# Patient Record
Sex: Male | Born: 1965 | Race: White | Hispanic: No | Marital: Married | Smoking: Never smoker
Health system: Southern US, Community
[De-identification: ages and names within clinical notes are randomized; demographics above are authoritative.]

## PROBLEM LIST (undated history)

## (undated) DIAGNOSIS — E119 Type 2 diabetes mellitus without complications: Secondary | ICD-10-CM

## (undated) DIAGNOSIS — E669 Obesity, unspecified: Secondary | ICD-10-CM

## (undated) DIAGNOSIS — E785 Hyperlipidemia, unspecified: Secondary | ICD-10-CM

## (undated) DIAGNOSIS — M1990 Unspecified osteoarthritis, unspecified site: Secondary | ICD-10-CM

## (undated) DIAGNOSIS — Z9989 Dependence on other enabling machines and devices: Secondary | ICD-10-CM

## (undated) DIAGNOSIS — K219 Gastro-esophageal reflux disease without esophagitis: Secondary | ICD-10-CM

## (undated) DIAGNOSIS — I10 Essential (primary) hypertension: Secondary | ICD-10-CM

## (undated) DIAGNOSIS — G4733 Obstructive sleep apnea (adult) (pediatric): Secondary | ICD-10-CM

## (undated) HISTORY — DX: Gastro-esophageal reflux disease without esophagitis: K21.9

## (undated) HISTORY — DX: Essential (primary) hypertension: I10

## (undated) HISTORY — DX: Hyperlipidemia, unspecified: E78.5

## (undated) HISTORY — PX: NO PAST SURGERIES: SHX2092

---

## 1999-05-19 DIAGNOSIS — I10 Essential (primary) hypertension: Secondary | ICD-10-CM

## 1999-05-19 HISTORY — DX: Essential (primary) hypertension: I10

## 2009-12-26 ENCOUNTER — Encounter: Payer: Self-pay | Admitting: Cardiology

## 2010-01-07 ENCOUNTER — Encounter: Payer: Self-pay | Admitting: Cardiology

## 2010-01-10 ENCOUNTER — Ambulatory Visit: Payer: Self-pay | Admitting: Cardiology

## 2010-01-10 ENCOUNTER — Encounter: Payer: Self-pay | Admitting: Cardiology

## 2010-01-27 ENCOUNTER — Ambulatory Visit: Payer: Self-pay | Admitting: Cardiology

## 2010-01-27 DIAGNOSIS — E669 Obesity, unspecified: Secondary | ICD-10-CM | POA: Insufficient documentation

## 2010-01-27 DIAGNOSIS — R0989 Other specified symptoms and signs involving the circulatory and respiratory systems: Secondary | ICD-10-CM

## 2010-01-27 DIAGNOSIS — I10 Essential (primary) hypertension: Secondary | ICD-10-CM | POA: Insufficient documentation

## 2010-01-27 DIAGNOSIS — I517 Cardiomegaly: Secondary | ICD-10-CM | POA: Insufficient documentation

## 2010-01-27 DIAGNOSIS — R0602 Shortness of breath: Secondary | ICD-10-CM | POA: Insufficient documentation

## 2010-01-27 DIAGNOSIS — R0609 Other forms of dyspnea: Secondary | ICD-10-CM | POA: Insufficient documentation

## 2010-01-27 DIAGNOSIS — I059 Rheumatic mitral valve disease, unspecified: Secondary | ICD-10-CM | POA: Insufficient documentation

## 2010-01-27 DIAGNOSIS — E876 Hypokalemia: Secondary | ICD-10-CM | POA: Insufficient documentation

## 2010-02-07 ENCOUNTER — Encounter: Payer: Self-pay | Admitting: Cardiology

## 2010-03-03 ENCOUNTER — Encounter: Payer: Self-pay | Admitting: Cardiology

## 2010-06-17 NOTE — Letter (Signed)
Summary: External Correspondence/ [facility]  External Correspondence/ [facility]   Imported By: Dorise Hiss 01/15/2010 15:20:32  _____________________________________________________________________  External Attachment:    Type:   Image     Comment:   External Document

## 2010-06-17 NOTE — Letter (Signed)
Summary: External Correspondence/ [facility]  External Correspondence/ [facility]   Imported By: Dorise Hiss 01/15/2010 15:19:28  _____________________________________________________________________  External Attachment:    Type:   Image     Comment:   External Document

## 2010-06-17 NOTE — Letter (Signed)
Summary: Appointment -missed  [facility] at [facility] [address]   [place], [place] [address]   Phone: [phone]  Fax: [phone]     March 03, 2010 MRN: [id number]     CLINTEN HOWK [address] [place], [place]  [address]     Dear Mr. Donzetta Matters,  Our records indicate you missed your appointment on March 03, 2010                        with Dr. Antoine Poche.   It is very important that we reach you to reschedule this appointment. We look forward to participating in your health care needs.   Please contact us at the number listed above at your earliest convenience to reschedule this appointment.   Sincerely,    Glass blower/designer

## 2010-06-17 NOTE — Letter (Signed)
Summary: External Correspondence/ FAXED [facility]ERR[facility]  External Correspondence/ FAXED [facility]   Imported By: Dorise Hiss 03/04/2010 15:27:10  _____________________________________________________________________  External Attachment:    Type:   Image     Comment:   External Document

## 2010-06-17 NOTE — Assessment & Plan Note (Signed)
Summary: NP-MANAGEMENT OF HYPERTENSION   Visit Type:  Initial Consult Primary Provider:  Kathe Mariner at [facility]  CC:  HTN.  History of Present Illness: The patient is referred for evaluation of difficult to control hypertension. He has had high blood pressure for years. Recently it was out of control as he was not taking his medicine. However, despite being back on an aggressive regimen he still is not at target blood pressure. An echocardiogram done recently demonstrated well-preserved left ventricular systolic function with moderate to severe left ventricular hypertrophy.  The patient has had no prior cardiac workup. He has had long-standing hypertension. He does get short of breath but only with significant exertion. He can push a lawnmower without dyspnea. He does not describe PND or orthopnea. He does not describe palpitations, presyncope or syncope. He has not chest neck or arm discomfort. He does have loud snoring and apparent apneic episodes. He has daytime headaches and somnolence.  Preventive Screening-Counseling & Management  Alcohol-Tobacco     Smoking Status: never  Current Medications (verified): 1)  Valturna 300-320 Mg Tabs (Aliskiren-Valsartan) .... Take 1 Tablet By Mouth Once A Day 2)  Hydrochlorothiazide 12.5 Mg Caps (Hydrochlorothiazide) .... Take 1 Tablet By Mouth Once A Day 3)  Metoprolol Tartrate 50 Mg Tabs (Metoprolol Tartrate) .... Take 1 Tablet By Mouth Two Times A Day 4)  Clonidine Hcl 0.1 Mg Tabs (Clonidine Hcl) .... Take 1 Tablet By Mouth Two Times A Day 5)  Simvastatin 40 Mg Tabs (Simvastatin) .... Take 1 Tablet By Mouth Once A Day 6)  Zyrtec Hives Relief 10 Mg Tabs (Cetirizine Hcl) .... Take 1 Tablet By Mouth Once A Day  Allergies (verified): No Known Drug Allergies  Comments:  Nurse/Medical Assistant: The patient's medication list and allergies were reviewed with the patient and were updated in the Medication and Allergy Lists.  Past  History:  Past Medical History: Hypertension x years Hyperlipidemia recent  Past Surgical History: None  Family History: Father Diabetes, HTN, CVA Mother Diabetes Brother CHF age 50  Social History: FULL TIME-US Comptroller  Alcohol Use - no Drug Use - no Divorced, no children Tobacco Smoking Status:  never  Review of Systems       Positive for sinus problems. Otherwise as stated in the history of present illness negative for all other systems.  Vital Signs:  Patient profile:   44 year old male Height:      74 inches Weight:      328 pounds BMI:     42.26 Pulse rate:   76 / minute BP sitting:   185 / 112  (left arm) Cuff size:   large  Vitals Entered By: Carlye Grippe (January 27, 2010 2:23 PM)  Nutrition Counseling: Patient's BMI is greater than 25 and therefore counseled on weight management options. CC: HTN   Physical Exam  General:  Well developed, well nourished, in no acute distress. Head:  normocephalic and atraumatic Eyes:  PERRLA/EOM intact; conjunctiva and lids normal. Mouth:  Teeth, gums and palate normal. Oral mucosa normal. Neck:  Neck supple, no JVD. No masses, thyromegaly or abnormal cervical nodes. Chest Wall:  no deformities or breast masses noted Lungs:  Clear bilaterally to auscultation and percussion. Abdomen:  Bowel sounds positive; abdomen soft and non-tender without masses, organomegaly, or hernias noted. No hepatosplenomegaly, obese Msk:  Back normal, normal gait. Muscle strength and tone normal. Extremities:  No clubbing or cyanosis. Neurologic:  Alert and oriented x 3. Skin:  Intact without lesions  or rashes. Cervical Nodes:  no significant adenopathy Axillary Nodes:  no significant adenopathy Inguinal Nodes:  no significant adenopathy Psych:  Normal affect.   Detailed Cardiovascular Exam  Neck    Carotids: Carotids full and equal bilaterally without bruits.      Neck Veins: Normal, no JVD.    Heart    Inspection:  no deformities or lifts noted.      Palpation: normal PMI with no thrills palpable.      Auscultation: regular rate and rhythm, S1, S2 without murmurs, rubs, or clicks.  Positive S4  Vascular    Abdominal Aorta: no palpable masses, pulsations, or audible bruits.      Femoral Pulses: normal femoral pulses bilaterally.      Pedal Pulses: normal pedal pulses bilaterally.      Radial Pulses: normal radial pulses bilaterally.      Peripheral Circulation: no clubbing, cyanosis,  with normal capillary refill.     EKG  Procedure date:  12/26/2009  Findings:      Sinus rhythm, rate 79, T wave inversions one in aVL, no old EKG for comparison  Impression & Recommendations:  Problem # 1:  HYPERTENSION (ICD-401.9) His blood pressure is difficult to control despite an aggressive regimen. He'll most definitely has sleep apnea contributing to this. We discussed this at length. We discussed therapeutic lifestyle changes. In particular he needs increased exercise and weight loss. If in the next few weeks his blood pressure is not coming down and is not successful with this I would most likely add amlodipine to his already aggressive regimen.  Problem # 2:  SNORING (ICD-786.09) The patient almost definitely has sleep apnea and he will have a sleep study and appropriate referral. We discussed the physiology and the need for weight loss.  Problem # 3:  OBESITY, UNSPECIFIED (ICD-278.00) I had a long discussion with him with specific instructions on dietary and weight loss issues as well as exercise.  Problem # 4:  CARDIOMEGALY (ICD-429.3) His LVH is related to his hypertension. This may regress as his blood pressure is controlled.  Other Orders: Neurology Referral (Neuro)  Patient Instructions: 1)  Sleep study - referral to Dr. Ninetta Lights 2)  Follow up in  6 weeks.

## 2010-11-24 ENCOUNTER — Encounter: Payer: Self-pay | Admitting: Cardiology

## 2013-05-18 DIAGNOSIS — E119 Type 2 diabetes mellitus without complications: Secondary | ICD-10-CM

## 2013-05-18 HISTORY — DX: Type 2 diabetes mellitus without complications: E11.9

## 2014-12-03 ENCOUNTER — Encounter (HOSPITAL_COMMUNITY): Admission: AD | Disposition: A | Discharge status: Home / Self Care | Payer: Self-pay | Source: Ambulatory Visit

## 2014-12-03 ENCOUNTER — Encounter (HOSPITAL_COMMUNITY): Payer: Self-pay | Admitting: Physician Assistant

## 2014-12-03 ENCOUNTER — Observation Stay (HOSPITAL_COMMUNITY)
Admission: AD | Admit: 2014-12-03 | Discharge: 2014-12-04 | Disposition: A | Discharge status: Home / Self Care | Payer: Federal, State, Local not specified - PPO | Source: Ambulatory Visit | Attending: Cardiovascular Disease | Admitting: Cardiovascular Disease

## 2014-12-03 DIAGNOSIS — I208 Other forms of angina pectoris: Secondary | ICD-10-CM | POA: Diagnosis present

## 2014-12-03 DIAGNOSIS — Z23 Encounter for immunization: Secondary | ICD-10-CM | POA: Diagnosis not present

## 2014-12-03 DIAGNOSIS — E119 Type 2 diabetes mellitus without complications: Secondary | ICD-10-CM | POA: Diagnosis not present

## 2014-12-03 DIAGNOSIS — I209 Angina pectoris, unspecified: Secondary | ICD-10-CM | POA: Diagnosis not present

## 2014-12-03 DIAGNOSIS — I200 Unstable angina: Secondary | ICD-10-CM | POA: Diagnosis not present

## 2014-12-03 DIAGNOSIS — I10 Essential (primary) hypertension: Secondary | ICD-10-CM | POA: Diagnosis not present

## 2014-12-03 DIAGNOSIS — G4733 Obstructive sleep apnea (adult) (pediatric): Secondary | ICD-10-CM | POA: Diagnosis not present

## 2014-12-03 DIAGNOSIS — E785 Hyperlipidemia, unspecified: Secondary | ICD-10-CM

## 2014-12-03 DIAGNOSIS — R9431 Abnormal electrocardiogram [ECG] [EKG]: Secondary | ICD-10-CM | POA: Diagnosis not present

## 2014-12-03 DIAGNOSIS — E669 Obesity, unspecified: Secondary | ICD-10-CM | POA: Insufficient documentation

## 2014-12-03 HISTORY — DX: Unspecified osteoarthritis, unspecified site: M19.90

## 2014-12-03 HISTORY — DX: Dependence on other enabling machines and devices: Z99.89

## 2014-12-03 HISTORY — DX: Obstructive sleep apnea (adult) (pediatric): G47.33

## 2014-12-03 HISTORY — DX: Obesity, unspecified: E66.9

## 2014-12-03 HISTORY — DX: Type 2 diabetes mellitus without complications: E11.9

## 2014-12-03 LAB — TROPONIN I

## 2014-12-03 LAB — GLUCOSE, CAPILLARY
GLUCOSE-CAPILLARY: 141 mg/dL — AB (ref 65–99)
Glucose-Capillary: 111 mg/dL — ABNORMAL HIGH (ref 65–99)

## 2014-12-03 LAB — CREATININE, SERUM
Creatinine, Ser: 1.06 mg/dL (ref 0.61–1.24)
GFR calc Af Amer: >60 mL/min (ref >60)
GFR calc non Af Amer: >60 mL/min (ref >60)

## 2014-12-03 LAB — CBC
HCT: 39.1 % (ref 39.0–52.0)
Hemoglobin: 12.8 g/dL — ABNORMAL LOW (ref 13.0–17.0)
MCH: 30 pg (ref 26.0–34.0)
MCHC: 32.7 g/dL (ref 30.0–36.0)
MCV: 91.6 fL (ref 78.0–100.0)
Platelets: 333 10*3/uL (ref 150–400)
RBC: 4.27 MIL/uL (ref 4.22–5.81)
RDW: 13.9 % (ref 11.5–15.5)
WBC: 7.9 10*3/uL (ref 4.0–10.5)

## 2014-12-03 LAB — TSH: TSH: 0.889 u[IU]/mL (ref 0.350–4.500)

## 2014-12-03 SURGERY — LEFT HEART CATH AND CORONARY ANGIOGRAPHY

## 2014-12-03 MED ORDER — LISINOPRIL 20 MG PO TABS
20.0000 mg | ORAL_TABLET | Freq: Every day | ORAL | Status: DC
  Administered 2014-12-04: 20 mg via ORAL
  Filled 2014-12-03 (×2): qty 1

## 2014-12-03 MED ORDER — NITROGLYCERIN 0.4 MG SL SUBL: 0.4000 mg | SUBLINGUAL_TABLET | SUBLINGUAL | Status: DC | PRN

## 2014-12-03 MED ORDER — HYDROCHLOROTHIAZIDE 12.5 MG PO CAPS
12.5000 mg | ORAL_CAPSULE | Freq: Every day | ORAL | Status: DC
  Administered 2014-12-04: 12.5 mg via ORAL
  Filled 2014-12-03 (×2): qty 1

## 2014-12-03 MED ORDER — LORATADINE 10 MG PO TABS
10.0000 mg | ORAL_TABLET | Freq: Every day | ORAL | Status: DC
  Administered 2014-12-04: 10 mg via ORAL
  Filled 2014-12-03 (×2): qty 1

## 2014-12-03 MED ORDER — ACETAMINOPHEN 325 MG PO TABS
650.0000 mg | ORAL_TABLET | ORAL | Status: DC | PRN
  Administered 2014-12-03: 650 mg via ORAL
  Filled 2014-12-03: qty 2

## 2014-12-03 MED ORDER — SODIUM CHLORIDE 0.9 % IJ SOLN: 3.0000 mL | INTRAMUSCULAR | Status: DC | PRN

## 2014-12-03 MED ORDER — SODIUM CHLORIDE 0.9 % IJ SOLN
3.0000 mL | Freq: Two times a day (BID) | INTRAMUSCULAR | Status: DC
  Administered 2014-12-03 – 2014-12-04 (×2): 3 mL via INTRAVENOUS

## 2014-12-03 MED ORDER — CLONIDINE HCL 0.1 MG PO TABS
0.1000 mg | ORAL_TABLET | Freq: Two times a day (BID) | ORAL | Status: DC
  Administered 2014-12-03 – 2014-12-04 (×2): 0.1 mg via ORAL
  Filled 2014-12-03 (×3): qty 1

## 2014-12-03 MED ORDER — INSULIN ASPART 100 UNIT/ML ~~LOC~~ SOLN: 0.0000 [IU] | Freq: Three times a day (TID) | SUBCUTANEOUS | Status: DC

## 2014-12-03 MED ORDER — ENOXAPARIN SODIUM 40 MG/0.4ML ~~LOC~~ SOLN
40.0000 mg | SUBCUTANEOUS | Status: DC
  Administered 2014-12-03: 40 mg via SUBCUTANEOUS
  Filled 2014-12-03 (×2): qty 0.4

## 2014-12-03 MED ORDER — ZOLPIDEM TARTRATE 5 MG PO TABS
5.0000 mg | ORAL_TABLET | Freq: Every evening | ORAL | Status: DC | PRN
  Filled 2014-12-03: qty 1

## 2014-12-03 MED ORDER — LISINOPRIL-HYDROCHLOROTHIAZIDE 20-12.5 MG PO TABS: 1.0000 | ORAL_TABLET | Freq: Every day | ORAL | Status: DC

## 2014-12-03 MED ORDER — SIMVASTATIN 40 MG PO TABS
40.0000 mg | ORAL_TABLET | Freq: Every day | ORAL | Status: DC
  Administered 2014-12-04: 40 mg via ORAL
  Filled 2014-12-03 (×2): qty 1

## 2014-12-03 MED ORDER — METOPROLOL TARTRATE 50 MG PO TABS
50.0000 mg | ORAL_TABLET | Freq: Two times a day (BID) | ORAL | Status: DC
  Administered 2014-12-03 – 2014-12-04 (×2): 50 mg via ORAL
  Filled 2014-12-03 (×3): qty 1

## 2014-12-03 MED ORDER — ONDANSETRON HCL 4 MG/2ML IJ SOLN: 4.0000 mg | Freq: Four times a day (QID) | INTRAMUSCULAR | Status: DC | PRN

## 2014-12-03 MED ORDER — ALPRAZOLAM 0.25 MG PO TABS
0.2500 mg | ORAL_TABLET | Freq: Two times a day (BID) | ORAL | Status: DC | PRN
  Filled 2014-12-03: qty 1

## 2014-12-03 MED ORDER — PNEUMOCOCCAL VAC POLYVALENT 25 MCG/0.5ML IJ INJ
0.5000 mL | INJECTION | INTRAMUSCULAR | Status: AC
  Administered 2014-12-04: 0.5 mL via INTRAMUSCULAR
  Filled 2014-12-03: qty 0.5

## 2014-12-03 MED ORDER — INSULIN ASPART 100 UNIT/ML ~~LOC~~ SOLN: 0.0000 [IU] | Freq: Every day | SUBCUTANEOUS | Status: DC

## 2014-12-03 MED ORDER — SODIUM CHLORIDE 0.9 % IV SOLN
250.0000 mL | INTRAVENOUS | Status: DC | PRN
Start: 2014-12-03 — End: 2014-12-04

## 2014-12-03 NOTE — Progress Notes (Signed)
Patient had arrived to unit 3E via [facility] as a direct admit. Patient taken to room 3E29. Rosaria Ferries, PA on unit and aware of patients arrival. Patient alert and oriented x4 and appears to be in no distress at this time. Patient has no complaints. Patient placed on cardiac monitor, CCMD notified. Patient oriented to unit, call bell and telephone in reach.

## 2014-12-03 NOTE — Progress Notes (Signed)
Cpap placed at bedside. Pt states he will attempt to wear later.

## 2014-12-03 NOTE — H&P (Signed)
History and Physical   Patient ID: Timothy Mcdonald MRN: [id number], DOB/AGE: [birth year] 49 y.o. Date of Encounter: 12/03/2014  Primary Physician: [facility], Dr Quintin Alto Primary Cardiologist: Dr Percival Spanish, 2011 in [place] for HTN  Chief Complaint:  abnl ECG, ?CAD  HPI: Timothy Mcdonald is a 49 y.o. male with a history of HTN, HL, DM, obesity, OSA. He has been having back problems and went to see his family physician. He has lumbar pain and occasional numbness that sometimes affects his ability to use his R leg.  While he was at his primary MD's office, he mentioned left arm numbness that was intermittent. His primary care physician did an ECG and it was concerning for STEMI so he was sent urgently to [facility], his ECG was still abnormal and he was sent to [facility] as a STEMI for cath. At [facility], he was evaluated by Dr. Gwenlyn Found and his ECG was repeated. It was felt that the abnormal ECG was due to LVH and Timothy Mcdonald is pain-free. It was decided to admit Timothy Mcdonald for observation and further management.  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 He feels that he is active with his job but does not exercise. He never gets chest pain with exertion. He feels he would probably have to stop going up a flight of steps due to shortness of breath but not chest pain. He notes that he has been sleeping in a chair recently but that is because of his left arm issues. When he sleeps in a bed or tries to sleep lying on his back, he will be wakened by left arm numbness and pain. He sleeps in a recliner, and props up his left side. He is able to rest that way. He does have some chronic orthopnea that has not changed recently, but denies PND. He has some mild lower extremity edema, left greater than right that is chronic and also has not changed recently.  He has no family history of premature CAD, but does have a history of CHF in his brother (without known CAD), and a history of  hypertension and diabetes in his father.   Past Medical History  Diagnosis Date  . HTN (hypertension) 2001  . HLD (hyperlipidemia)   . Diabetes mellitus type II, non insulin dependent 2015  . Obesity     Surgical History:  Past Surgical History  Procedure Laterality Date  . None       I have reviewed the patient's current medications. Medication  Amlodipine 5 mg qd  cetirizine (ZYRTEC HIVES RELIEF) 10 MG tablet  cloNIDine (CATAPRES) 0.1 MG tablet bid  Lisinopril/HCTZ 20/12.5 qd  metoprolol (LOPRESSOR) 50 MG tablet BID  Metformin 500 mg bid  simvastatin (ZOCOR) 40 MG tablet   Scheduled Meds: . cloNIDine  0.1 mg Oral BID  . enoxaparin (LOVENOX) injection  40 mg Subcutaneous Q24H  . lisinopril  20 mg Oral Daily   And  . hydrochlorothiazide  12.5 mg Oral Daily  . insulin aspart  0-15 Units Subcutaneous TID WC  . insulin aspart  0-5 Units Subcutaneous QHS  . loratadine  10 mg Oral Daily  . metoprolol  50 mg Oral BID  . simvastatin  40 mg Oral Daily  . sodium chloride  3 mL Intravenous Q12H   Continuous Infusions:   PRN Meds:.sodium chloride, acetaminophen, ALPRAZolam, nitroGLYCERIN, ondansetron (ZOFRAN) IV, sodium chloride, zolpidem  Allergies: No Known Allergies  History   Social History  . Marital Status:  Married    Spouse Name: N/A  . Number of Children: N/A  . Years of Education: N/A   Occupational History  . [employer]    Social History Main Topics  . Smoking status: Never Smoker   . Smokeless tobacco: Never Used  . Alcohol Use: No  . Drug Use: No  . Sexual Activity: Not on file   Other Topics Concern  . Not on file   Social History Narrative   Married, lives in [place]. No history of CAD in parents or siblings. Brother has CHF, not sure why.   Full time - [employer].     Family History  Problem Relation Age of Onset  . Heart failure Brother    Family Status  Relation Status Death Age  . Father [place], diabetes, HTN,  CVA  . Mother Deceased 88    diabetes  . Brother Alive     CHF - age 78, many other health issues    Review of Systems:   Full 14-point review of systems otherwise negative except as noted above.  Physical Exam: Blood pressure 149/97, pulse 69, temperature 98.3 F (36.8 C), temperature source Oral, resp. rate 18, height 6\' 2"  (1.88 m), weight 334 lb 1.6 oz (151.547 kg), SpO2 97 %. General: Well developed, well nourished,male in no acute distress. Head: Normocephalic, atraumatic, sclera non-icteric, no xanthomas, nares are without discharge. Dentition: false teeth Neck: No carotid bruits. JVD not elevated. No thyromegally Lungs: Good expansion bilaterally. without wheezes or rhonchi.  Heart: Regular rate and rhythm with S1 S2.  No S3 or S4.  No murmur, no rubs, or gallops appreciated. Abdomen: Soft, non-tender, non-distended with normoactive bowel sounds. No hepatomegaly. No rebound/guarding. No obvious abdominal masses. Msk:  Strength and tone appear normal for age. No joint deformities or effusions, no spine or costo-vertebral angle tenderness. Extremities: No clubbing or cyanosis. No edema.  Distal pedal pulses are 2+ in 4 extrem Neuro: Alert and oriented X 3. Moves all extremities spontaneously. No focal deficits noted. Psych:  Responds to questions appropriately with a normal affect. Skin: No rashes or lesions noted  ECG: SR, LVH, w/ J point elevation in V1&2 with lateral TWI  ASSESSMENT AND PLAN:  Principal Problem:   Possible Unstable angina, abnormal ECG - admit observation, r/o MI - ECG is abnormal, follow - monitor for ischemic symptoms - if ez remain negative, OP MV and f/u in [place] - ck echo  Active Problems:   HTN (hypertension) - continue home rx      HLD (hyperlipidemia) - ck LFTs and profile - continue statin    Diabetes mellitus type II, non insulin dependent - hold metformin in case dye study needed - SSI, ck A1c  Plan: if ez are negative, d/c in am w/  OP MV (2-day study)  Signed, Rosaria Ferries, PA-C 12/03/2014 4:03 PM Beeper [phone]  I saw & examined the patient on the day after admission (was seen by Dr. Gwenlyn Found yesterday).   He has ruled out for MI. EKG is abnormal & he will need ischemic evaluation with TM Myoview as OP.  We will arrange OP f/u.    Restart home DM & BP meds. On stating for RF modification.  Echo done - formal read pending.  Expect d/c as Trop Neg.  Leonie Man, MD

## 2014-12-04 ENCOUNTER — Observation Stay (HOSPITAL_BASED_OUTPATIENT_CLINIC_OR_DEPARTMENT_OTHER): Payer: Federal, State, Local not specified - PPO

## 2014-12-04 ENCOUNTER — Other Ambulatory Visit: Payer: Self-pay | Admitting: Physician Assistant

## 2014-12-04 DIAGNOSIS — I200 Unstable angina: Secondary | ICD-10-CM | POA: Diagnosis not present

## 2014-12-04 DIAGNOSIS — I10 Essential (primary) hypertension: Secondary | ICD-10-CM | POA: Diagnosis not present

## 2014-12-04 DIAGNOSIS — E785 Hyperlipidemia, unspecified: Secondary | ICD-10-CM | POA: Diagnosis not present

## 2014-12-04 DIAGNOSIS — R079 Chest pain, unspecified: Secondary | ICD-10-CM | POA: Diagnosis not present

## 2014-12-04 DIAGNOSIS — R9431 Abnormal electrocardiogram [ECG] [EKG]: Secondary | ICD-10-CM | POA: Diagnosis present

## 2014-12-04 DIAGNOSIS — I209 Angina pectoris, unspecified: Secondary | ICD-10-CM | POA: Diagnosis not present

## 2014-12-04 DIAGNOSIS — R0789 Other chest pain: Secondary | ICD-10-CM

## 2014-12-04 DIAGNOSIS — I208 Other forms of angina pectoris: Secondary | ICD-10-CM | POA: Diagnosis present

## 2014-12-04 DIAGNOSIS — E669 Obesity, unspecified: Secondary | ICD-10-CM | POA: Diagnosis present

## 2014-12-04 DIAGNOSIS — E119 Type 2 diabetes mellitus without complications: Secondary | ICD-10-CM | POA: Diagnosis not present

## 2014-12-04 LAB — GLUCOSE, CAPILLARY
GLUCOSE-CAPILLARY: 113 mg/dL — AB (ref 65–99)
GLUCOSE-CAPILLARY: 111 mg/dL — AB (ref 65–99)

## 2014-12-04 LAB — COMPREHENSIVE METABOLIC PANEL
ALK PHOS: 57 U/L (ref 38–126)
ALT: 18 U/L (ref 17–63)
AST: 16 U/L (ref 15–41)
Albumin: 3.6 g/dL (ref 3.5–5.0)
Anion gap: 5 (ref 5–15)
BUN: 15 mg/dL (ref 6–20)
CO2: 30 mmol/L (ref 22–32)
Calcium: 9.5 mg/dL (ref 8.9–10.3)
Chloride: 103 mmol/L (ref 101–111)
Creatinine, Ser: 1.13 mg/dL (ref 0.61–1.24)
GFR calc Af Amer: >60 mL/min (ref >60)
GFR calc non Af Amer: >60 mL/min (ref >60)
Glucose, Bld: 131 mg/dL — ABNORMAL HIGH (ref 65–99)
Potassium: 3.4 mmol/L — ABNORMAL LOW (ref 3.5–5.1)
SODIUM: 138 mmol/L (ref 135–145)
Total Bilirubin: 0.7 mg/dL (ref 0.3–1.2)
Total Protein: 6.2 g/dL — ABNORMAL LOW (ref 6.5–8.1)

## 2014-12-04 LAB — LIPID PANEL
Cholesterol: 214 mg/dL — ABNORMAL HIGH (ref 0–200)
HDL: 32 mg/dL — ABNORMAL LOW (ref >40)
LDL Cholesterol: 142 mg/dL — ABNORMAL HIGH (ref 0–99)
TRIGLYCERIDES: 201 mg/dL — AB (ref <150)
Total CHOL/HDL Ratio: 6.7 RATIO
VLDL: 40 mg/dL (ref 0–40)

## 2014-12-04 LAB — HEMOGLOBIN A1C
HEMOGLOBIN A1C: 6.6 % — AB (ref 4.8–5.6)
Mean Plasma Glucose: 143 mg/dL

## 2014-12-04 LAB — TROPONIN I: Troponin I: <0.03 ng/mL (ref <0.031)

## 2014-12-04 MED ORDER — CLONIDINE HCL 0.1 MG PO TABS: 0.1000 mg | ORAL_TABLET | Freq: Two times a day (BID) | ORAL | Status: DC

## 2014-12-04 MED ORDER — METOPROLOL TARTRATE 50 MG PO TABS: 50.0000 mg | ORAL_TABLET | Freq: Two times a day (BID) | ORAL | Status: DC

## 2014-12-04 MED ORDER — PERFLUTREN LIPID MICROSPHERE
1.0000 mL | INTRAVENOUS | Status: AC | PRN
  Administered 2014-12-04: 2 mL via INTRAVENOUS
  Filled 2014-12-04: qty 10

## 2014-12-04 NOTE — Progress Notes (Signed)
   12/04/14 0050  BiPAP/CPAP/SIPAP  BiPAP/CPAP/SIPAP Pt Type Adult  Mask Type Nasal mask  Mask Size Medium  Set Rate 0 breaths/min  Respiratory Rate 18 breaths/min  IPAP (imax 20)  EPAP (e min 6)  BiPAP/CPAP/SIPAP CPAP  Patient Home Equipment No  Auto Titrate Yes  BiPAP/CPAP /SiPAP Vitals  Pulse Rate 74  SpO2 94 %  pt placed on cpap with above settings. Tolerating well at this time.

## 2014-12-04 NOTE — Progress Notes (Signed)
  Echocardiogram 2D Echocardiogram has been performed.  Jennette Dubin 12/04/2014, 11:36 AM

## 2014-12-04 NOTE — Discharge Summary (Signed)
Discharge Summary   Patient ID: Timothy Mcdonald,  MRN: [id number], DOB/AGE: 06-03-1965 49 y.o.  Admit date: 12/03/2014 Discharge date: 12/04/2014  Primary Care Provider: Jamal Maes Primary Cardiologist: Dr Percival Spanish, 2011 in [place] for HTN. Will establish with [facility]  Discharge Diagnoses Principal Problem:   Atypical angina - L arm numbness triggerred EKG, no Chest discomfort Active Problems:   Essential hypertension   Hyperlipidemia with target LDL less than 100   Diabetes mellitus type II, non insulin dependent   Obese   Abnormal finding on EKG   Allergies No Known Allergies  Procedures  Echocardiogram 12/04/2014 LV EF: 65% -  70%  ------------------------------------------------------------------- Indications:   Chest pain 786.51.  ------------------------------------------------------------------- History:  PMH:  Dyspnea. Risk factors: Obesity. Hypertension. Diabetes mellitus. Dyslipidemia.  ------------------------------------------------------------------- Study Conclusions  - Left ventricle: The cavity size was normal. Wall thickness was increased in a pattern of severe LVH. Systolic function was vigorous. The estimated ejection fraction was in the range of 65% to 70%. Wall motion was normal; there were no regional wall motion abnormalities. Doppler parameters are consistent with abnormal left ventricular relaxation (grade 1 diastolic dysfunction). - Left atrium: The atrium was mildly dilated. - Right atrium: The atrium was mildly dilated.  Impressions:  - Technically difficult; definity used; vigorous LV function; severe LVH; grade 1 diastolic dysfunction; mild biatrial enlargement.    Hospital Course  Patient is a 49 year old male with history of HTN, HLD, DM, obesity, OSA. He was having back pain issues and went to see his primary care physician. While at his PCPs office, he mentioned left arm numbness which was  intermittent. EKG obtained concerning for STEMI and he was sent emergently to [facility]. He continued to be abnormal EKG while at [facility] and was transferred to [facility] subsequently for urgent cath. He was evaluated by Dr. Gwenlyn Found and EKG was repeated, it was felt abnormal EKG was due to LVH and he was chest pain-free on arrival. He was admitted to cardiology service for observation overnight. He denies any recent paroxysmal nocturnal dyspnea and there was no HF signs on exam.   Overnight, his serial troponin was negative. Lipid panel was obtained on 7/19 which showed cholesterol 214, triglycerides 201, HDL 32, LDL 142. Echocardiogram was obtained which shows normal EF 65-70%, no regional wall motion abnormalities, grade 1 diastolic dysfunction. Given negative troponin, he is deemed stable for discharge from cardiology perspective. I have arranged outpatient 2 day treadmill nuclear stress test at [facility]. He has been instructed to hold metoprolol in the morning of the test and be NPO 6 hours prior to the test. I have also arranged 2 weeks outpatient follow-up with Dr. Harl Bowie in our [place] office.  Discharge Vitals Blood pressure 151/88, pulse 68, temperature 98 F (36.7 C), temperature source Oral, resp. rate 18, height 6\' 2"  (1.88 m), weight 333 lb 4.8 oz (151.184 kg), SpO2 97 %.  Filed Weights   12/03/14 1454 12/04/14 0657  Weight: 334 lb 1.6 oz (151.547 kg) 333 lb 4.8 oz (151.184 kg)    PHYSICAL EXAM  General: Pleasant, NAD. Morbidly obese Neuro: Alert and oriented X 3. Moves all extremities spontaneously. Psych: Normal affect. HEENT: Normal Neck: Supple without bruits or JVD. Lungs: Resp regular and unlabored, CTA. Heart: RRR no s3, s4, or murmurs. Abdomen: Soft, non-tender, non-distended, BS + x 4.  Extremities: No clubbing, cyanosis or edema. DP/PT/Radials 2+ and equal bilaterally.  Labs  CBC  Recent Labs  12/03/14 1513  WBC 7.9  HGB 12.8*  HCT 39.1    MCV 91.6  PLT 458   Basic Metabolic Panel  Recent Labs  12/03/14 1513 12/04/14 0305  NA  --  138  K  --  3.4*  CL  --  103  CO2  --  30  GLUCOSE  --  131*  BUN  --  15  CREATININE 1.06 1.13  CALCIUM  --  9.5   Liver Function Tests  Recent Labs  12/04/14 0305  AST 16  ALT 18  ALKPHOS 57  BILITOT 0.7  PROT 6.2*  ALBUMIN 3.6   Cardiac Enzymes  Recent Labs  12/03/14 1513 12/03/14 2050 12/04/14 0305  TROPONINI <0.03 <0.03 <0.03   Hemoglobin A1C  Recent Labs  12/03/14 1513  HGBA1C 6.6*   Fasting Lipid Panel  Recent Labs  12/04/14 0305  CHOL 214*  HDL 32*  LDLCALC 142*  TRIG 201*  CHOLHDL 6.7   Thyroid Function Tests  Recent Labs  12/03/14 1513  TSH 0.889    Disposition  Pt is being discharged home today in good condition.  Follow-up Plans & Appointments      Follow-up Information    Follow up with Carlyle Dolly, MD On 12/24/2014.   Specialty:  Cardiology   Why:  @2 :00pm   Contact information:   [place] [address] [phone]       Follow up with [facility].   Why:  Hospital will contact you to arrange 2 day nuclear stress test, no food or drink in AM of the test, hold metoprolol in the morning as well, sips of water with medication ok    Contact information:   [address] [place] [phone]      Discharge Medications    Medication List    TAKE these medications        amLODipine 5 MG tablet  Commonly known as:  NORVASC  Take 5 mg by mouth daily.     cloNIDine 0.1 MG tablet  Commonly known as:  CATAPRES  Take 1 tablet (0.1 mg total) by mouth 2 (two) times daily.     [employer] BACKACHE RELIEF 500 MG Tabs  Generic drug:  Magnesium Salicylate  Take 2 tablets by mouth daily as needed (back pain).     fluticasone 50 MCG/ACT nasal spray  Commonly known as:  FLONASE  Place 1 spray into both nostrils daily as needed for allergies or rhinitis.      ibuprofen 200 MG tablet  Commonly known as:  ADVIL,MOTRIN  Take 800 mg by mouth daily as needed (back pain).     ICY HOT EX  Apply 1 application topically daily. Apply to lower back     lisinopril-hydrochlorothiazide 20-12.5 MG per tablet  Commonly known as:  PRINZIDE,ZESTORETIC  Take 1 tablet by mouth daily.     metFORMIN 500 MG 24 hr tablet  Commonly known as:  GLUCOPHAGE-XR  Take 500 mg by mouth daily with breakfast.     metoprolol 50 MG tablet  Commonly known as:  LOPRESSOR  Take 1 tablet (50 mg total) by mouth 2 (two) times daily.     simvastatin 40 MG tablet  Commonly known as:  ZOCOR  Take 40 mg by mouth daily.     ZYRTEC HIVES RELIEF 10 MG tablet  Generic drug:  cetirizine  Take 10 mg by mouth daily.        Outstanding Labs/Studies  2 day treadmill nuclear stress test  Duration of Discharge Encounter   Greater than 30 minutes including physician time.  Hilbert Corrigan PA-C Pager: [phone] 12/04/2014, 12:09 PM

## 2014-12-05 ENCOUNTER — Telehealth: Payer: Self-pay | Admitting: Cardiovascular Disease

## 2014-12-05 NOTE — Telephone Encounter (Signed)
D/C phone call Appt on 12/24/14 w/ Dr.Branch .[name] Thanks

## 2014-12-05 NOTE — Telephone Encounter (Signed)
Timothy Mcdonald is returning the phone cal . Please call .[name]

## 2014-12-05 NOTE — Telephone Encounter (Signed)
LMTCB  Patient contacted regarding discharge from [facility] on 12/04/2014.  Patient understands to follow up with provider Dr Harl Bowie on 12/24/2014 at 2:00pm at [facility]. Patient understands discharge instructions? yes  Patient understands medications and regiment? yes  Patient understands to bring all medications to this visit? yes   Spoke with Wife Tashan Kreitzer, patient heart is doing fine, still having some back issues; has appt with pcp next week.

## 2014-12-17 ENCOUNTER — Other Ambulatory Visit: Payer: Self-pay | Admitting: Cardiology

## 2014-12-17 DIAGNOSIS — R079 Chest pain, unspecified: Secondary | ICD-10-CM

## 2014-12-18 ENCOUNTER — Encounter (HOSPITAL_COMMUNITY)
Admission: RE | Admit: 2014-12-18 | Discharge: 2014-12-18 | Disposition: A | Discharge status: Home / Self Care | Payer: Federal, State, Local not specified - PPO | Source: Ambulatory Visit | Attending: Cardiology | Admitting: Cardiology

## 2014-12-18 ENCOUNTER — Ambulatory Visit (HOSPITAL_COMMUNITY): Admission: RE | Admit: 2014-12-18 | Payer: Self-pay | Source: Ambulatory Visit

## 2014-12-18 ENCOUNTER — Encounter (HOSPITAL_COMMUNITY): Payer: Self-pay

## 2014-12-18 DIAGNOSIS — R079 Chest pain, unspecified: Secondary | ICD-10-CM | POA: Insufficient documentation

## 2014-12-18 MED ORDER — REGADENOSON 0.4 MG/5ML IV SOLN
INTRAVENOUS | Status: AC
  Filled 2014-12-18: qty 5

## 2014-12-18 MED ORDER — TECHNETIUM TC 99M SESTAMIBI - CARDIOLITE
30.0000 | Freq: Once | INTRAVENOUS | Status: AC | PRN
  Administered 2014-12-18: 29.7 via INTRAVENOUS

## 2014-12-18 MED ORDER — SODIUM CHLORIDE 0.9 % IJ SOLN
INTRAMUSCULAR | Status: AC
  Administered 2014-12-18: 10 mL via INTRAVENOUS
  Filled 2014-12-18: qty 3

## 2014-12-19 ENCOUNTER — Encounter (HOSPITAL_COMMUNITY)
Admission: RE | Admit: 2014-12-19 | Discharge: 2014-12-19 | Disposition: A | Discharge status: Home / Self Care | Payer: Federal, State, Local not specified - PPO | Source: Ambulatory Visit | Attending: Cardiology | Admitting: Cardiology

## 2014-12-19 ENCOUNTER — Encounter (HOSPITAL_COMMUNITY): Payer: Self-pay

## 2014-12-19 DIAGNOSIS — R079 Chest pain, unspecified: Secondary | ICD-10-CM

## 2014-12-19 LAB — NM MYOCAR MULTI W/SPECT W/WALL MOTION / EF
Estimated workload: 9.2 METS
Exercise duration (min): 7 min
Exercise duration (sec): 2 s
LV dias vol: 145 mL
LV sys vol: 68 mL
MPHR: 173 {beats}/min
Peak HR: 160 {beats}/min
Percent HR: 92 %
RATE: 0.3
Rest HR: 85 {beats}/min
SDS: 6
SRS: 1
SSS: 7
TID: 0.93

## 2014-12-19 MED ORDER — TECHNETIUM TC 99M SESTAMIBI GENERIC - CARDIOLITE
25.0000 | Freq: Once | INTRAVENOUS | Status: AC | PRN
  Administered 2014-12-19: 23 via INTRAVENOUS

## 2014-12-24 ENCOUNTER — Encounter: Payer: Self-pay | Admitting: Cardiology

## 2014-12-25 ENCOUNTER — Encounter: Payer: Self-pay | Admitting: Cardiology

## 2014-12-25 ENCOUNTER — Ambulatory Visit (INDEPENDENT_AMBULATORY_CARE_PROVIDER_SITE_OTHER): Payer: Federal, State, Local not specified - PPO | Admitting: Cardiology

## 2014-12-25 VITALS — BP 136/87 | HR 71 | Ht 74.0 in | Wt 332.0 lb

## 2014-12-25 DIAGNOSIS — I517 Cardiomegaly: Secondary | ICD-10-CM

## 2014-12-25 DIAGNOSIS — I10 Essential (primary) hypertension: Secondary | ICD-10-CM

## 2014-12-25 DIAGNOSIS — E785 Hyperlipidemia, unspecified: Secondary | ICD-10-CM | POA: Diagnosis not present

## 2014-12-25 DIAGNOSIS — G4733 Obstructive sleep apnea (adult) (pediatric): Secondary | ICD-10-CM

## 2014-12-25 MED ORDER — ROSUVASTATIN CALCIUM 5 MG PO TABS: 5.0000 mg | ORAL_TABLET | Freq: Every day | ORAL | Status: DC

## 2014-12-25 MED ORDER — AMLODIPINE BESYLATE 10 MG PO TABS: 10.0000 mg | ORAL_TABLET | Freq: Every day | ORAL | Status: DC

## 2014-12-25 NOTE — Patient Instructions (Signed)
Your physician recommends that you schedule a follow-up appointment in: [place] DR. Ladson  Your physician has recommended you make the following change in your medication:   STOP ZOCOR  START CRESTOR 5 MG DAILY  INCREASE AMLODIPINE 10 MG DAILY  START ASPIRIN 81 MG DAILY  Your physician has requested that you regularly monitor and record your blood pressure readings at home FOR 2 WEEKS AND CALL RESULTS TO [place]. Please use the same machine at the same time of day to check your readings and record them to bring to your follow-up visit.  Thank you for choosing [facility]!!

## 2014-12-25 NOTE — Progress Notes (Signed)
Patient ID: Timothy Mcdonald, male   DOB: Oct 04, 1965, 49 y.o.   MRN: [id number]     Clinical Summary [name] is a 49 y.o.male seen as a hospital follow up appointment, this is our first visit together.  1. Abnormal EKG - recent admit 11/2014 with left arm numbess and abnormal EKG. No chest pain - serial troponins negative. Echo with LVEF 65-70%, no WMAs, severe LVH. Discharged with plans for outpatient stress test. - 12/2014 exercise MPI downsloping ST depression inferior leads in setting of hypertensive exercise response, normal perfusion imaging. Exercised 7 minutes.  - denies any recent chest pain or SOB  2. HTN - checks occasionally at home, 140s/80s - compliant with meds  3. HL - 11/2014 TC 214 TG 201 HDL 32 LDL 142 - liptor caused muscle aches, has been on simva 40mg  daily.   4. OSA - mixed compliance    5. DM2 - followed by pcp  Past Medical History  Diagnosis Date  . HTN (hypertension) 2001  . HLD (hyperlipidemia)   . Obesity   . Diabetes mellitus type II, non insulin dependent 2015  . OSA on CPAP     "got mask; wear it part-time" (12/03/2014)  . Arthritis     "joints" (12/03/2014)     No Known Allergies   Current Outpatient Prescriptions  Medication Sig Dispense Refill  . amLODipine (NORVASC) 5 MG tablet Take 5 mg by mouth daily.     . cetirizine (ZYRTEC HIVES RELIEF) 10 MG tablet Take 10 mg by mouth daily.      . cloNIDine (CATAPRES) 0.1 MG tablet Take 1 tablet (0.1 mg total) by mouth 2 (two) times daily.    . fluticasone (FLONASE) 50 MCG/ACT nasal spray Place 1 spray into both nostrils daily as needed for allergies or rhinitis.     [name] ibuprofen (ADVIL,MOTRIN) 200 MG tablet Take 800 mg by mouth daily as needed (back pain).    [name] lisinopril-hydrochlorothiazide (PRINZIDE,ZESTORETIC) 20-12.5 MG per tablet Take 1 tablet by mouth daily.    . Magnesium Salicylate (CVS BACKACHE RELIEF) 500 MG TABS Take 2 tablets by mouth daily as needed (back pain).    . Menthol,  Topical Analgesic, (ICY HOT EX) Apply 1 application topically daily. Apply to lower back    . metFORMIN (GLUCOPHAGE-XR) 500 MG 24 hr tablet Take 500 mg by mouth daily with breakfast.     . metoprolol (LOPRESSOR) 50 MG tablet Take 1 tablet (50 mg total) by mouth 2 (two) times daily.    . simvastatin (ZOCOR) 40 MG tablet Take 40 mg by mouth daily.       No current facility-administered medications for this visit.     Past Surgical History  Procedure Laterality Date  . No past surgeries       No Known Allergies    Family History  Problem Relation Age of Onset  . Heart failure Brother      Social History [name] reports that he has never smoked. He has never used smokeless tobacco. [name] reports that he does not drink alcohol.   Review of Systems CONSTITUTIONAL: No weight loss, fever, chills, weakness or fatigue.  HEENT: Eyes: No visual loss, blurred vision, double vision or yellow sclerae.No hearing loss, sneezing, congestion, runny nose or sore throat.  SKIN: No rash or itching.  CARDIOVASCULAR: per HPI RESPIRATORY: No shortness of breath, cough or sputum.  GASTROINTESTINAL: No anorexia, nausea, vomiting or diarrhea. No abdominal pain or blood.  GENITOURINARY: No burning on urination,  no polyuria NEUROLOGICAL: No headache, dizziness, syncope, paralysis, ataxia, numbness or tingling in the extremities. No change in bowel or bladder control.  MUSCULOSKELETAL: No muscle, back pain, joint pain or stiffness.  LYMPHATICS: No enlarged nodes. No history of splenectomy.  PSYCHIATRIC: No history of depression or anxiety.  ENDOCRINOLOGIC: No reports of sweating, cold or heat intolerance. No polyuria or polydipsia.  [name]   Physical Examination Filed Vitals:   12/25/14 1555  BP: 136/87  Pulse: 71   Filed Vitals:   12/25/14 1555  Height: 6\' 2"  (1.88 m)  Weight: 332 lb (150.594 kg)    Gen: resting comfortably, no acute distress HEENT: no scleral icterus, pupils  equal round and reactive, no palptable cervical adenopathy,  CV: RRR, no m/r/g, no JVD Resp: Clear to auscultation bilaterally GI: abdomen is soft, non-tender, non-distended, normal bowel sounds, no hepatosplenomegaly MSK: extremities are warm, no edema.  Skin: warm, no rash Neuro:  no focal deficits Psych: appropriate affect   Diagnostic Studies  12/2014 Exercise MPI  Blood pressure demonstrated a hypertensive response to exercise.  Downsloping ST segment depression ST segment depression of 1.9 mm was noted during stress in the III leads.  Myocardial perfusion is normal with no evidence of ischemia or scar.  This is a low risk study.  Nuclear stress EF: 53%.   11/2014 echo Study Conclusions  - Left ventricle: The cavity size was normal. Wall thickness was increased in a pattern of severe LVH. Systolic function was vigorous. The estimated ejection fraction was in the range of 65% to 70%. Wall motion was normal; there were no regional wall motion abnormalities. Doppler parameters are consistent with abnormal left ventricular relaxation (grade 1 diastolic dysfunction). - Left atrium: The atrium was mildly dilated. - Right atrium: The atrium was mildly dilated.  Impressions:  - Technically difficult; definity used; vigorous LV function; severe LVH; grade 1 diastolic dysfunction; mild biatrial enlargement.    Assessment and Plan   1. LVH/Abnormal EKG - evidence of severe LVH by EKG and echo, likely due to poorly controlled HTN - intensify bp control  2. HTN - above goal given his DM2 should be <130/80 - will increase norvasc to 10mg  daily - he will drop off bp log in 2 weeks  3. HL - not at goal. Side effects on lipitor. Will stop zocor, try crestor 5mg  daily  4. OSA - encouraged CPAP compliance  5. DM2 - start ASA 81mg  daily for primary prevention   F/u 6 weeks  Arnoldo Lenis, M.D.

## 2015-02-01 ENCOUNTER — Ambulatory Visit: Payer: Federal, State, Local not specified - PPO | Admitting: Cardiology

## 2015-03-05 ENCOUNTER — Encounter: Payer: Self-pay | Admitting: *Deleted

## 2015-11-13 IMAGING — NM NM MYOCAR MULTI W/SPECT W/WALL MOTION & EF
2 series · 12 of 12 positions shown · non-contrast
Comparison: none

[Series 1: stress gated · 8.28mm/px · 6 of 64 frames shown]
[frame 6/64]
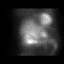
[frame 16/64]
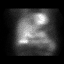
[frame 27/64]
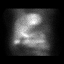
[frame 38/64]
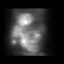
[frame 48/64]
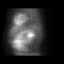
[frame 59/64]
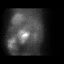

[Series 2: rest · 8.28mm/px · 6 of 64 frames shown]
[frame 6/64]
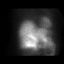
[frame 16/64]
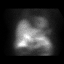
[frame 27/64]
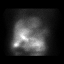
[frame 38/64]
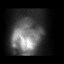
[frame 48/64]
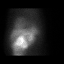
[frame 59/64]
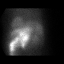

[12 of 12 positions shown; findings below may reference images not displayed]

Canned report from images found in remote index.

Refer to host system for actual result text.

## 2015-12-04 DIAGNOSIS — I10 Essential (primary) hypertension: Secondary | ICD-10-CM | POA: Diagnosis not present

## 2015-12-04 DIAGNOSIS — E669 Obesity, unspecified: Secondary | ICD-10-CM | POA: Diagnosis not present

## 2015-12-04 DIAGNOSIS — E119 Type 2 diabetes mellitus without complications: Secondary | ICD-10-CM | POA: Diagnosis not present

## 2015-12-04 DIAGNOSIS — R739 Hyperglycemia, unspecified: Secondary | ICD-10-CM | POA: Diagnosis not present

## 2015-12-04 DIAGNOSIS — E782 Mixed hyperlipidemia: Secondary | ICD-10-CM | POA: Diagnosis not present

## 2015-12-12 DIAGNOSIS — I10 Essential (primary) hypertension: Secondary | ICD-10-CM | POA: Diagnosis not present

## 2015-12-12 DIAGNOSIS — E119 Type 2 diabetes mellitus without complications: Secondary | ICD-10-CM | POA: Diagnosis not present

## 2015-12-12 DIAGNOSIS — G4733 Obstructive sleep apnea (adult) (pediatric): Secondary | ICD-10-CM | POA: Diagnosis not present

## 2015-12-12 DIAGNOSIS — E782 Mixed hyperlipidemia: Secondary | ICD-10-CM | POA: Diagnosis not present

## 2015-12-29 ENCOUNTER — Other Ambulatory Visit: Payer: Self-pay | Admitting: Cardiology

## 2016-06-08 DIAGNOSIS — R0602 Shortness of breath: Secondary | ICD-10-CM | POA: Diagnosis not present

## 2016-06-08 DIAGNOSIS — R112 Nausea with vomiting, unspecified: Secondary | ICD-10-CM | POA: Diagnosis not present

## 2016-06-08 DIAGNOSIS — Z6841 Body Mass Index (BMI) 40.0 and over, adult: Secondary | ICD-10-CM | POA: Diagnosis not present

## 2016-06-08 DIAGNOSIS — R1084 Generalized abdominal pain: Secondary | ICD-10-CM | POA: Diagnosis not present

## 2016-06-12 DIAGNOSIS — E119 Type 2 diabetes mellitus without complications: Secondary | ICD-10-CM | POA: Diagnosis not present

## 2016-06-12 DIAGNOSIS — G4733 Obstructive sleep apnea (adult) (pediatric): Secondary | ICD-10-CM | POA: Diagnosis not present

## 2016-06-12 DIAGNOSIS — E782 Mixed hyperlipidemia: Secondary | ICD-10-CM | POA: Diagnosis not present

## 2016-06-12 DIAGNOSIS — I10 Essential (primary) hypertension: Secondary | ICD-10-CM | POA: Diagnosis not present

## 2016-06-15 DIAGNOSIS — I10 Essential (primary) hypertension: Secondary | ICD-10-CM | POA: Diagnosis not present

## 2016-06-15 DIAGNOSIS — E1165 Type 2 diabetes mellitus with hyperglycemia: Secondary | ICD-10-CM | POA: Diagnosis not present

## 2016-06-15 DIAGNOSIS — E782 Mixed hyperlipidemia: Secondary | ICD-10-CM | POA: Diagnosis not present

## 2016-06-15 DIAGNOSIS — G4733 Obstructive sleep apnea (adult) (pediatric): Secondary | ICD-10-CM | POA: Diagnosis not present

## 2016-07-01 DIAGNOSIS — D649 Anemia, unspecified: Secondary | ICD-10-CM | POA: Diagnosis not present

## 2016-07-07 DIAGNOSIS — D649 Anemia, unspecified: Secondary | ICD-10-CM | POA: Diagnosis not present

## 2016-07-21 LAB — IFOBT (OCCULT BLOOD): IFOBT: NEGATIVE

## 2016-07-27 ENCOUNTER — Encounter: Payer: Self-pay | Admitting: Gastroenterology

## 2016-08-19 ENCOUNTER — Ambulatory Visit (INDEPENDENT_AMBULATORY_CARE_PROVIDER_SITE_OTHER): Payer: Federal, State, Local not specified - PPO | Admitting: Gastroenterology

## 2016-08-19 ENCOUNTER — Other Ambulatory Visit: Payer: Self-pay

## 2016-08-19 ENCOUNTER — Encounter: Payer: Self-pay | Admitting: Gastroenterology

## 2016-08-19 VITALS — BP 146/90 | HR 65 | Temp 99.0°F | Ht 74.5 in | Wt 323.6 lb

## 2016-08-19 DIAGNOSIS — D649 Anemia, unspecified: Secondary | ICD-10-CM

## 2016-08-19 DIAGNOSIS — D509 Iron deficiency anemia, unspecified: Secondary | ICD-10-CM

## 2016-08-19 DIAGNOSIS — K219 Gastro-esophageal reflux disease without esophagitis: Secondary | ICD-10-CM

## 2016-08-19 DIAGNOSIS — D500 Iron deficiency anemia secondary to blood loss (chronic): Secondary | ICD-10-CM | POA: Insufficient documentation

## 2016-08-19 MED ORDER — PANTOPRAZOLE SODIUM 40 MG PO TBEC: 40.0000 mg | DELAYED_RELEASE_TABLET | Freq: Every day | ORAL | 3 refills | Status: DC

## 2016-08-19 MED ORDER — PEG 3350-KCL-NA BICARB-NACL 420 G PO SOLR
4000.0000 mL | ORAL | 0 refills | Status: DC
Start: 2016-08-19 — End: 2016-12-29

## 2016-08-19 NOTE — Patient Instructions (Addendum)
For reflux: start taking Protonix once each morning, 30 minutes before breakfast. I have attached a handout on this.  We have scheduled you for a colonoscopy and possible upper endoscopy with Dr. Oneida Alar in the near future!   Food Choices for Gastroesophageal Reflux Disease, Adult When you have gastroesophageal reflux disease (GERD), the foods you eat and your eating habits are very important. Choosing the right foods can help ease your discomfort. What guidelines do I need to follow?  Choose fruits, vegetables, whole grains, and low-fat dairy products.  Choose low-fat meat, fish, and poultry.  Limit fats such as oils, salad dressings, butter, nuts, and avocado.  Keep a food diary. This helps you identify foods that cause symptoms.  Avoid foods that cause symptoms. These may be different for everyone.  Eat small meals often instead of 3 large meals a day.  Eat your meals slowly, in a place where you are relaxed.  Limit fried foods.  Cook foods using methods other than frying.  Avoid drinking alcohol.  Avoid drinking large amounts of liquids with your meals.  Avoid bending over or lying down until 2-3 hours after eating. What foods are not recommended? These are some foods and drinks that may make your symptoms worse: Vegetables  Tomatoes. Tomato juice. Tomato and spaghetti sauce. Chili peppers. Onion and garlic. Horseradish. Fruits  Oranges, grapefruit, and lemon (fruit and juice). Meats  High-fat meats, fish, and poultry. This includes hot dogs, ribs, ham, sausage, salami, and bacon. Dairy  Whole milk and chocolate milk. Sour cream. Cream. Butter. Ice cream. Cream cheese. Drinks  Coffee and tea. Bubbly (carbonated) drinks or energy drinks. Condiments  Hot sauce. Barbecue sauce. Sweets/Desserts  Chocolate and cocoa. Donuts. Peppermint and spearmint. Fats and Oils  High-fat foods. This includes [place] fries and potato chips. Other  Vinegar. Strong spices. This  includes black pepper, white pepper, red pepper, cayenne, curry powder, cloves, ginger, and chili powder. The items listed above may not be a complete list of foods and drinks to avoid. Contact your dietitian for more information.  This information is not intended to replace advice given to you by your health care provider. Make sure you discuss any questions you have with your health care provider. Document Released: 11/03/2011 Document Revised: 10/10/2015 Document Reviewed: 03/08/2013 Elsevier Interactive Patient Education  2017 [employer].

## 2016-08-19 NOTE — Assessment & Plan Note (Addendum)
52 year old male with normocytic mild anemia, Hgb recently 12, ferritin normal at 181, iron low normal at 38. Unknown creatinine but requesting records. Heme negative. No concerning lower or upper GI symptoms, no overt GI bleeding. Has never had colonoscopy.   Proceed with colonoscopy +/- EGD with Dr. Oneida Alar in the near future. The risks, benefits, and alternatives have been discussed in detail with the patient. They state understanding and desire to proceed.  Phenergan 12.5 mg IV on call

## 2016-08-19 NOTE — Assessment & Plan Note (Signed)
No alarm features. No PPI currently. Start Protonix once each morning. Dietary/behavior modifications discussed.

## 2016-08-19 NOTE — Progress Notes (Addendum)
REVIEWED-NO ADDITIONAL RECOMMENDATIONS.  Primary Care Physician:  [name] Primary Gastroenterologist:  Dr. Oneida Alar   Chief Complaint  Patient presents with  . Anemia    HPI:   Timothy Mcdonald is a 51 y.o. male presenting today at the request of his primary care provider due to anemia. Hgb 12.0, Hct low normal at 37.5. Platelets 475. Ferritin normal at 181, iron low normal at 38, B12 normal, folate normal, heme negative.   No rectal bleeding. No constipation, diarrhea, changes in bowel habit. No abdominal pain. Notes burning and belching a lot. No PPI. Takes pepto bismol every now and then. Takes aleve for back pain. No dysphagia. No weight loss or lack of appetite. No prior colonoscopy.    Past Medical History:  Diagnosis Date  . Arthritis    "joints" (12/03/2014)  . Diabetes mellitus type II, non insulin dependent ([place]) 2015  . GERD (gastroesophageal reflux disease)   . HLD (hyperlipidemia)   . HTN (hypertension) 2001  . Obesity   . OSA on CPAP    "got mask; wear it part-time" (12/03/2014)    Past Surgical History:  Procedure Laterality Date  . NO PAST SURGERIES      Current Outpatient Prescriptions  Medication Sig Dispense Refill  . amLODipine (NORVASC) 10 MG tablet TAKE ONE TABLET BY MOUTH ONCE DAILY 7 tablet 0  . aspirin 81 MG tablet Take 81 mg by mouth daily.    . cetirizine (ZYRTEC HIVES RELIEF) 10 MG tablet Take 10 mg by mouth daily.      . cloNIDine (CATAPRES) 0.1 MG tablet Take 1 tablet (0.1 mg total) by mouth 2 (two) times daily.    . fluticasone (FLONASE) 50 MCG/ACT nasal spray Place 1 spray into both nostrils daily as needed for allergies or rhinitis.     [name] ibuprofen (ADVIL,MOTRIN) 200 MG tablet Take 800 mg by mouth daily as needed (back pain).    [name] lisinopril-hydrochlorothiazide (PRINZIDE,ZESTORETIC) 20-12.5 MG per tablet Take 1 tablet by mouth daily.    . Magnesium Salicylate (CVS BACKACHE RELIEF) 500 MG TABS Take 2 tablets by mouth daily as needed  (back pain).    . Menthol, Topical Analgesic, (ICY HOT EX) Apply 1 application topically daily. Apply to lower back    . metFORMIN (GLUCOPHAGE-XR) 500 MG 24 hr tablet Take 500 mg by mouth daily with breakfast.     . metoprolol (LOPRESSOR) 50 MG tablet Take 1 tablet (50 mg total) by mouth 2 (two) times daily.    . rosuvastatin (CRESTOR) 5 MG tablet Take 1 tablet (5 mg total) by mouth daily. 90 tablet 3  . pantoprazole (PROTONIX) 40 MG tablet Take 1 tablet (40 mg total) by mouth daily. Take 30 minutes before breakfast 90 tablet 3   No current facility-administered medications for this visit.     Allergies as of 08/19/2016  . (No Known Allergies)    Family History  Problem Relation Age of Onset  . Heart failure Brother   . Colon cancer Maternal Uncle   . Colon polyps Neg Hx     Social History   Social History  . Marital status: Married    Spouse name: N/A  . Number of children: N/A  . Years of education: N/A   Occupational History  . [employer]    Social History Main Topics  . Smoking status: Never Smoker  . Smokeless tobacco: Never Used  . Alcohol use No  . Drug use: No  . Sexual activity: Yes  Other Topics Concern  . Not on file   Social History Narrative   Married, lives in [place]. No history of CAD in parents or siblings. Brother has CHF, not sure why.   Full time - [employer].     Review of Systems: Gen: Denies any fever, chills, fatigue, weight loss, lack of appetite.  CV: Denies chest pain, heart palpitations, peripheral edema, syncope.  Resp: a lot of coughing at night  GI: see HPI  GU : Denies urinary burning, urinary frequency, urinary hesitancy MS: back pain  Derm: Denies rash, itching, dry skin Psych: Denies depression, anxiety, memory loss, and confusion Heme: see HPI   Physical Exam: BP (!) 146/90   Pulse 65   Temp 99 F (37.2 C) (Oral)   Ht 6' 2.5" (1.892 m)   Wt (!) 323 lb 9.6 oz (146.8 kg)   BMI 40.99 kg/m  General:   Alert  and oriented. Pleasant and cooperative. Well-nourished and well-developed.  Head:  Normocephalic and atraumatic. Eyes:  Without icterus, sclera clear and conjunctiva pink.  Ears:  Normal auditory acuity. Nose:  No deformity, discharge,  or lesions. Mouth:  No deformity or lesions, oral mucosa pink.  Lungs:  Clear to auscultation bilaterally. No wheezes, rales, or rhonchi. No distress.  Heart:  S1, S2 present without murmurs appreciated.  Abdomen:  +BS, soft, non-tender and non-distended. No HSM noted. No guarding or rebound. No masses appreciated.  Rectal:  Deferred  Msk:  Symmetrical without gross deformities. Normal posture. Extremities:  Without edema. Neurologic:  Alert and  oriented x4;  grossly normal neurologically. Psych:  Alert and cooperative. Normal mood and affect.

## 2016-08-20 NOTE — Progress Notes (Signed)
cc'ed to pcp °

## 2016-09-02 ENCOUNTER — Telehealth: Payer: Self-pay

## 2016-09-02 NOTE — Telephone Encounter (Signed)
Called pt. TCS/+/-EGD moved up 09/07/16 to 9:30am. Pt to arrive at 8:30am. Advised pt to start drinking prep that morning at 4:30am and complete by 6:30am. Will inform endo scheduler.

## 2016-09-07 ENCOUNTER — Ambulatory Visit (HOSPITAL_COMMUNITY)
Admission: RE | Admit: 2016-09-07 | Discharge: 2016-09-07 | Disposition: A | Discharge status: Home / Self Care | Payer: Federal, State, Local not specified - PPO | Source: Ambulatory Visit | Attending: Gastroenterology | Admitting: Gastroenterology

## 2016-09-07 ENCOUNTER — Encounter (HOSPITAL_COMMUNITY)
Admission: RE | Disposition: A | Discharge status: Home / Self Care | Payer: Self-pay | Source: Ambulatory Visit | Attending: Gastroenterology

## 2016-09-07 ENCOUNTER — Encounter (HOSPITAL_COMMUNITY): Payer: Self-pay | Admitting: *Deleted

## 2016-09-07 DIAGNOSIS — K621 Rectal polyp: Secondary | ICD-10-CM | POA: Diagnosis not present

## 2016-09-07 DIAGNOSIS — E785 Hyperlipidemia, unspecified: Secondary | ICD-10-CM | POA: Diagnosis not present

## 2016-09-07 DIAGNOSIS — I10 Essential (primary) hypertension: Secondary | ICD-10-CM | POA: Insufficient documentation

## 2016-09-07 DIAGNOSIS — Z6841 Body Mass Index (BMI) 40.0 and over, adult: Secondary | ICD-10-CM | POA: Insufficient documentation

## 2016-09-07 DIAGNOSIS — D128 Benign neoplasm of rectum: Secondary | ICD-10-CM

## 2016-09-07 DIAGNOSIS — E119 Type 2 diabetes mellitus without complications: Secondary | ICD-10-CM | POA: Diagnosis not present

## 2016-09-07 DIAGNOSIS — K648 Other hemorrhoids: Secondary | ICD-10-CM | POA: Insufficient documentation

## 2016-09-07 DIAGNOSIS — Q438 Other specified congenital malformations of intestine: Secondary | ICD-10-CM | POA: Diagnosis not present

## 2016-09-07 DIAGNOSIS — Z7984 Long term (current) use of oral hypoglycemic drugs: Secondary | ICD-10-CM | POA: Diagnosis not present

## 2016-09-07 DIAGNOSIS — M1990 Unspecified osteoarthritis, unspecified site: Secondary | ICD-10-CM | POA: Insufficient documentation

## 2016-09-07 DIAGNOSIS — K5730 Diverticulosis of large intestine without perforation or abscess without bleeding: Secondary | ICD-10-CM | POA: Diagnosis not present

## 2016-09-07 DIAGNOSIS — K219 Gastro-esophageal reflux disease without esophagitis: Secondary | ICD-10-CM | POA: Insufficient documentation

## 2016-09-07 DIAGNOSIS — K2950 Unspecified chronic gastritis without bleeding: Secondary | ICD-10-CM | POA: Insufficient documentation

## 2016-09-07 DIAGNOSIS — Z8371 Family history of colonic polyps: Secondary | ICD-10-CM | POA: Diagnosis not present

## 2016-09-07 DIAGNOSIS — Z7982 Long term (current) use of aspirin: Secondary | ICD-10-CM | POA: Diagnosis not present

## 2016-09-07 DIAGNOSIS — K2970 Gastritis, unspecified, without bleeding: Secondary | ICD-10-CM

## 2016-09-07 DIAGNOSIS — G4733 Obstructive sleep apnea (adult) (pediatric): Secondary | ICD-10-CM | POA: Diagnosis not present

## 2016-09-07 DIAGNOSIS — D500 Iron deficiency anemia secondary to blood loss (chronic): Secondary | ICD-10-CM | POA: Diagnosis not present

## 2016-09-07 DIAGNOSIS — Z800 Family history of malignant neoplasm of digestive organs: Secondary | ICD-10-CM | POA: Insufficient documentation

## 2016-09-07 DIAGNOSIS — Z8249 Family history of ischemic heart disease and other diseases of the circulatory system: Secondary | ICD-10-CM | POA: Insufficient documentation

## 2016-09-07 DIAGNOSIS — E669 Obesity, unspecified: Secondary | ICD-10-CM | POA: Diagnosis not present

## 2016-09-07 DIAGNOSIS — I890 Lymphedema, not elsewhere classified: Secondary | ICD-10-CM | POA: Diagnosis not present

## 2016-09-07 DIAGNOSIS — D509 Iron deficiency anemia, unspecified: Secondary | ICD-10-CM

## 2016-09-07 HISTORY — PX: COLONOSCOPY: SHX5424

## 2016-09-07 HISTORY — PX: ESOPHAGOGASTRODUODENOSCOPY: SHX5428

## 2016-09-07 LAB — GLUCOSE, CAPILLARY: Glucose-Capillary: 109 mg/dL — ABNORMAL HIGH (ref 65–99)

## 2016-09-07 SURGERY — COLONOSCOPY
Anesthesia: Moderate Sedation

## 2016-09-07 MED ORDER — PROMETHAZINE HCL 25 MG/ML IJ SOLN
INTRAMUSCULAR | Status: AC
  Filled 2016-09-07: qty 1

## 2016-09-07 MED ORDER — MIDAZOLAM HCL 5 MG/5ML IJ SOLN
INTRAMUSCULAR | Status: DC | PRN
  Administered 2016-09-07 (×2): 2 mg via INTRAVENOUS
  Administered 2016-09-07: 1 mg via INTRAVENOUS

## 2016-09-07 MED ORDER — SODIUM CHLORIDE 0.9 % IV SOLN
INTRAVENOUS | Status: DC
  Administered 2016-09-07: 09:00:00 via INTRAVENOUS

## 2016-09-07 MED ORDER — LIDOCAINE VISCOUS 2 % MT SOLN
OROMUCOSAL | Status: DC
Start: 2016-09-07 — End: 2016-09-07
  Filled 2016-09-07: qty 15

## 2016-09-07 MED ORDER — STERILE WATER FOR IRRIGATION IR SOLN
Status: DC | PRN
  Administered 2016-09-07: 2.5 mL

## 2016-09-07 MED ORDER — MEPERIDINE HCL 100 MG/ML IJ SOLN
INTRAMUSCULAR | Status: AC
  Filled 2016-09-07: qty 2

## 2016-09-07 MED ORDER — PROMETHAZINE HCL 25 MG/ML IJ SOLN
12.5000 mg | Freq: Once | INTRAMUSCULAR | Status: AC
  Administered 2016-09-07: 12.5 mg via INTRAVENOUS

## 2016-09-07 MED ORDER — SODIUM CHLORIDE 0.9% FLUSH
INTRAVENOUS | Status: AC
  Filled 2016-09-07: qty 10

## 2016-09-07 MED ORDER — MEPERIDINE HCL 100 MG/ML IJ SOLN
INTRAMUSCULAR | Status: DC | PRN
  Administered 2016-09-07: 25 mg via INTRAVENOUS
  Administered 2016-09-07: 50 mg via INTRAVENOUS

## 2016-09-07 MED ORDER — MIDAZOLAM HCL 5 MG/5ML IJ SOLN
INTRAMUSCULAR | Status: AC
  Filled 2016-09-07: qty 10

## 2016-09-07 NOTE — Op Note (Signed)
[facility] Patient Name: Timothy Mcdonald Procedure Date: 09/07/2016 9:20 AM MRN: [id number] Date of Birth: Apr 06, 1966 Attending MD: Barney Drain , MD CSN: [id number] Age: 51 Admit Type: Outpatient Procedure:                Colonoscopy WITH SNARE CAUTERY POLYPECTOMY Indications:              Iron deficiency anemia secondary to chronic blood                            loss Providers:                Barney Drain, MD, Charlyne Petrin RN, RN, Rosina Lowenstein, RN Referring MD:             Curlene Labrum Medicines:                Meperidine 75 mg IV, Midazolam 4 mg IV Complications:            No immediate complications. Estimated Blood Loss:     Estimated blood loss: none. Procedure:                Pre-Anesthesia Assessment:                           - Prior to the procedure, a History and Physical                            was performed, and patient medications and                            allergies were reviewed. The patient's tolerance of                            previous anesthesia was also reviewed. The risks                            and benefits of the procedure and the sedation                            options and risks were discussed with the patient.                            All questions were answered, and informed consent                            was obtained. Prior Anticoagulants: The patient has                            taken aspirin. ASA Grade Assessment: II - A patient                            with mild systemic disease. After reviewing the  risks and benefits, the patient was deemed in                            satisfactory condition to undergo the procedure.                            After obtaining informed consent, the colonoscope                            was passed under direct vision. Throughout the                            procedure, the patient's blood pressure, pulse, and                  oxygen saturations were monitored continuously. The                            EC-[id number] ([id number]) scope was introduced through                            the anus and advanced to the 5 cm into the ileum.                            The colonoscopy was technically difficult and                            complex due to significant looping. Successful                            completion of the procedure was aided by changing                            the patient to a supine position, using manual                            pressure, straightening and shortening the scope to                            obtain bowel loop reduction and COLOWRAP. The                            patient tolerated the procedure well. The quality                            of the bowel preparation was good. The terminal                            ileum, ileocecal valve, appendiceal orifice, and                            rectum were photographed. Scope In: 9:53:04 AM Scope Out: 10:15:45 AM Scope Withdrawal Time: 0 hours 14 minutes 46 seconds  Total Procedure Duration: 0 hours 22 minutes 41 seconds  Findings:      The terminal ileum appeared normal.      A 6 mm polyp was found in the rectum. The polyp was sessile. The polyp       was removed with a hot snare. Resection and retrieval were complete.      Multiple small and large-mouthed diverticula were found in the sigmoid       colon, descending colon, splenic flexure and transverse colon.      Internal hemorrhoids were found during retroflexion. The hemorrhoids       were small.      The recto-sigmoid colon, sigmoid colon and descending colon were       significantly redundant. Impression:               - The examined portion of the ileum was normal.                           - One 6 mm polyp in the rectum, removed with a hot                            snare. Resected and retrieved.                           - Diverticulosis in the sigmoid colon,  in the                            descending colon, at the splenic flexure and in the                            transverse colon.                           - Internal hemorrhoids.                           - EXTREMELY Redundant LEFTcolon. Moderate Sedation:      Moderate (conscious) sedation was administered by the endoscopy nurse       and supervised by the endoscopist. The following parameters were       monitored: oxygen saturation, heart rate, blood pressure, and response       to care. Total physician intraservice time was 50 minutes. Recommendation:           - High fiber diet. CONSIDER PLANT BASED DIET.                           - Continue present medications.                           - Await pathology results.                           - Repeat colonoscopy in 5-10 years for surveillance.                           - Patient has a contact number available for  emergencies. The signs and symptoms of potential                            delayed complications were discussed with the                            patient. Return to normal activities tomorrow.                            Written discharge instructions were provided to the                            patient. Procedure Code(s):        --- Professional ---                           [phone], Colonoscopy, flexible; with removal of                            tumor(s), polyp(s), or other lesion(s) by snare                            technique                           99152, Moderate sedation services provided by the                            same physician or other qualified health care                            professional performing the diagnostic or                            therapeutic service that the sedation supports,                            requiring the presence of an independent trained                            observer to assist in the monitoring of the                             patient's level of consciousness and physiological                            status; initial 15 minutes of intraservice time,                            patient age 68 years or older                           [phone], Moderate sedation services; each additional  15 minutes intraservice time                           99153, Moderate sedation services; each additional                            15 minutes intraservice time Diagnosis Code(s):        --- Professional ---                           K64.8, Other hemorrhoids                           K62.1, Rectal polyp                           D50.0, Iron deficiency anemia secondary to blood                            loss (chronic)                           K57.30, Diverticulosis of large intestine without                            perforation or abscess without bleeding                           Q43.8, Other specified congenital malformations of                            intestine CPT copyright 2016 American Medical Association. All rights reserved. The codes documented in this report are preliminary and upon coder review may  be revised to meet current compliance requirements. Barney Drain, MD Barney Drain, MD 09/07/2016 10:35:32 AM This report has been signed electronically. Number of Addenda: 0

## 2016-09-07 NOTE — Op Note (Signed)
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 Estimated Blood Loss:     Estimated blood loss was minimal. Procedure:                Pre-Anesthesia Assessment:                           - Prior to the procedure, a History and Physical                            was performed, and patient medications and                            allergies were reviewed. The patient's tolerance of                            previous anesthesia was also reviewed. The risks                            and benefits of the procedure and the sedation                            options and risks were discussed with the patient.                            All questions were answered, and informed consent                            was obtained. Prior Anticoagulants: The patient has                            taken aspirin. ASA Grade Assessment: II - A patient                            with mild systemic disease. After reviewing the  risks and benefits, the patient was deemed in                            satisfactory condition to undergo the procedure.                            After obtaining informed consent, the endoscope was                            passed under direct vision. Throughout the                            procedure, the patient's blood pressure, pulse, and                           oxygen saturations were monitored continuously. The                            EG-[id number] ([id number]) scope was introduced through the                            mouth, and advanced to the second part of duodenum.                            The upper GI endoscopy was technically difficult                            and complex due to the patient's agitation.                            Successful completion of the procedure was aided by                            increasing the dose of sedation medication. The                            patient tolerated the procedure fairly well. Scope In: 10:22:50 AM Scope Out: 10:29:50 AM Total Procedure Duration: 0 hours 7 minutes 0 seconds  Findings:      The examined esophagus was normal.      Patchy mild inflammation characterized by congestion (edema), erosions       and erythema was found in the gastric antrum. Biopsies were taken with a       cold forceps for Helicobacter pylori testing.      Lymphangiectasia was present in the second portion of the duodenum. Impression:               - MILD NORMOCYTIC ANEMIA MOST LIKELY DUE TO NSAID                            INDUCED GASTRITIS                           - Duodenal mucosal lymphangiectasia. Moderate Sedation:      Moderate (conscious) sedation was administered by the endoscopy  nurse       and supervised by the endoscopist. The following parameters were       monitored: oxygen saturation, heart rate, blood pressure, and response       to care. Total physician intraservice time was 50 minutes. Recommendation:           - Await pathology results.                           - Continue present medications. CONSIDER GIVENS IF                            PATIENT DEVELOPS Hb < 12.5 ON PPI.                           - High fiber diet and low fat diet. TRANSITION TO A                            PLANT BASED DIET.                           - Patient has a contact number available for                             emergencies. The signs and symptoms of potential                            delayed complications were discussed with the                            patient. Return to normal activities tomorrow.                            Written discharge instructions were provided to the                            patient. Procedure Code(s):        --- Professional ---                           [phone], Esophagogastroduodenoscopy, flexible,                            transoral; with biopsy, single or multiple                           99152, Moderate sedation services provided by the                            same physician or other qualified health care                            professional performing the diagnostic or                            therapeutic service that the sedation supports,  requiring the presence of an independent trained                            observer to assist in the monitoring of the                            patient's level of consciousness and physiological                            status; initial 15 minutes of intraservice time,                            patient age 12 years or older                           [phone], Moderate sedation services; each additional                            15 minutes intraservice time                           99153, Moderate sedation services; each additional                            15 minutes intraservice time Diagnosis Code(s):        --- Professional ---                           K29.70, Gastritis, unspecified, without bleeding                           I89.0, Lymphedema, not elsewhere classified                           D50.0, Iron deficiency anemia secondary to blood                            loss (chronic) CPT copyright 2016 American Medical Association. All rights reserved. The codes documented in this report are preliminary and upon coder review may  be revised to meet current  compliance requirements. Barney Drain, MD Barney Drain, MD 09/07/2016 10:40:38 AM This report has been signed electronically. Number of Addenda: 0

## 2016-09-07 NOTE — Discharge Instructions (Signed)
YOUR BORDERLINE LOW BLOOD COUNT IS MOST LIKELY DUE TO GASTRITIS AND THE POLYP. You had 1 polyp removed. You have SMALL internal hemorrhoids & DIVERTICULOSIS IN YOUR RIGHT AND LEFT COLON.  I biopsied your stomach.    DRINK WATER TO KEEP YOUR URINE LIGHT YELLOW.  CONTINUE YOUR WEIGHT LOSS EFFORTS. LOSE 50 LBS. YOU SHOULD TRANSITION TO A PLANT BASED DIET-NO MEAT OR DAIRY FOR 6 MOS. AVOID ITEMS THAT CAUSE BLOATING & GAS. I RECOMMEND THE BOOK, "PREVENT AND REVERSE HEART DISEASE". CALDWELL ESSELTYN JR., MD. PAGE 120-121 CLEARLY STATE THE RULES AND QUICK AND EASY RECIPES FOR BREAKFAST, LUNCH, AND DINNER ARE AFTER P 127.   WHILE I DO NOT WANT TO ALARM YOU, YOUR BODY MASS INDEX IS OVER 40 WHICH MEANS YOU ARE MORBIDLY OBES. OBESITY TURNS ON CANCER GENES AND IS ASSOCIATED WITH AN INCREASE RISK FOR ALL CANCERS, INCLUDING ESOPHAGEAL AND COLON CANCER. AS WEL HAVING A BODY MASS INDEX OVER 40 SHORTENS YOUR LIFE EXPECTANCY 10 YEARS.  CONTINUE PROTONIX. TAKE 30 MINUTES PRIOR TO BREAKFAST.  YOUR BIOPSY RESULTS WILL BE AVAILABLE IN MY CHART AFTER APR 27 AND MY OFFICE WILL CONTACT YOU IN 10-14 DAYS WITH YOUR RESULTS.   Next colonoscopy in 5-10 years.    ENDOSCOPY Care After Read the instructions outlined below and refer to this sheet in the next week. These discharge instructions provide you with general information on caring for yourself after you leave the hospital. While your treatment has been planned according to the most current medical practices available, unavoidable complications occasionally occur. If you have any problems or questions after discharge, call DR. Ervine Witucki, [phone].  ACTIVITY  You may resume your regular activity, but move at a slower pace for the next 24 hours.   Take frequent rest periods for the next 24 hours.   Walking will help get rid of the air and reduce the bloated feeling in your belly (abdomen).   No driving for 24 hours (because of the medicine (anesthesia) used during  the test).   You may shower.   Do not sign any important legal documents or operate any machinery for 24 hours (because of the anesthesia used during the test).    NUTRITION  Drink plenty of fluids.   You may resume your normal diet as instructed by your doctor.   Begin with a light meal and progress to your normal diet. Heavy or fried foods are harder to digest and may make you feel sick to your stomach (nauseated).   Avoid alcoholic beverages for 24 hours or as instructed.    MEDICATIONS  You may resume your normal medications.   WHAT YOU CAN EXPECT TODAY  Some feelings of bloating in the abdomen.   Passage of more gas than usual.   Spotting of blood in your stool or on the toilet paper  .  IF YOU HAD POLYPS REMOVED DURING THE ENDOSCOPY:  Eat a soft diet IF YOU HAVE NAUSEA, BLOATING, ABDOMINAL PAIN, OR VOMITING.    FINDING OUT THE RESULTS OF YOUR TEST Not all test results are available during your visit. DR. Oneida Alar WILL CALL YOU WITHIN 14 DAYS OF YOUR PROCEDUE WITH YOUR RESULTS. Do not assume everything is normal if you have not heard from DR. Harvie Morua, CALL HER OFFICE AT [phone].  SEEK IMMEDIATE MEDICAL ATTENTION AND CALL THE OFFICE: [phone] IF:  You have more than a spotting of blood in your stool.   Your belly is swollen (abdominal distention).   You are nauseated or vomiting.  You have a temperature over 101F.   You have abdominal pain or discomfort that is severe or gets worse throughout the day.   Gastritis  Gastritis is an inflammation (the body's way of reacting to injury and/or infection) of the stomach. It is often caused by viral or bacterial (germ) infections. It can also be caused BY ASPIRIN, BC/GOODY POWDER'S, (IBUPROFEN) MOTRIN, OR ALEVE (NAPROXEN), chemicals (including alcohol), SPICY FOODS, and medications. This illness may be associated with generalized malaise (feeling tired, not well), UPPER ABDOMINAL STOMACH cramps, and fever. One  common bacterial cause of gastritis is an organism known as H. Pylori. This can be treated with antibiotics.    High-Fiber Diet A high-fiber diet changes your normal diet to include more whole grains, legumes, fruits, and vegetables. Changes in the diet involve replacing refined carbohydrates with unrefined foods. The calorie level of the diet is essentially unchanged. The Dietary Reference Intake (recommended amount) for adult males is 38 grams per day. For adult females, it is 25 grams per day. Pregnant and lactating women should consume 28 grams of fiber per day. Fiber is the intact part of a plant that is not broken down during digestion. Functional fiber is fiber that has been isolated from the plant to provide a beneficial effect in the body. PURPOSE  Increase stool bulk.   Ease and regulate bowel movements.   Lower cholesterol.   REDUCE RISK OF COLON CANCER  INDICATIONS THAT YOU NEED MORE FIBER  Constipation and hemorrhoids.   Uncomplicated diverticulosis (intestine condition) and irritable bowel syndrome.   Weight management.   As a protective measure against hardening of the arteries (atherosclerosis), diabetes, and cancer.   GUIDELINES FOR INCREASING FIBER IN THE DIET  Start adding fiber to the diet slowly. A gradual increase of about 5 more grams (2 slices of whole-wheat bread, 2 servings of most fruits or vegetables, or 1 bowl of high-fiber cereal) per day is best. Too rapid an increase in fiber may result in constipation, flatulence, and bloating.   Drink enough water and fluids to keep your urine clear or pale yellow. Water, juice, or caffeine-free drinks are recommended. Not drinking enough fluid may cause constipation.   Eat a variety of high-fiber foods rather than one type of fiber.   Try to increase your intake of fiber through using high-fiber foods rather than fiber pills or supplements that contain small amounts of fiber.   The goal is to change the types of  food eaten. Do not supplement your present diet with high-fiber foods, but replace foods in your present diet.   INCLUDE A VARIETY OF FIBER SOURCES  Replace refined and processed grains with whole grains, canned fruits with fresh fruits, and incorporate other fiber sources. White rice, white breads, and most bakery goods contain little or no fiber.   Brown whole-grain rice, buckwheat oats, and many fruits and vegetables are all good sources of fiber. These include: broccoli, Brussels sprouts, cabbage, cauliflower, beets, sweet potatoes, white potatoes (skin on), carrots, tomatoes, eggplant, squash, berries, fresh fruits, and dried fruits.   Cereals appear to be the richest source of fiber. Cereal fiber is found in whole grains and bran. Bran is the fiber-rich outer coat of cereal grain, which is largely removed in refining. In whole-grain cereals, the bran remains. In breakfast cereals, the largest amount of fiber is found in those with "bran" in their names. The fiber content is sometimes indicated on the label.   You may need to include additional fruits  and vegetables each day.   In baking, for 1 cup white flour, you may use the following substitutions:   1 cup whole-wheat flour minus 2 tablespoons.   1/2 cup white flour plus 1/2 cup whole-wheat flour.   Hemorrhoids Hemorrhoids are dilated (enlarged) veins around the rectum. Sometimes clots will form in the veins. This makes them swollen and painful. These are called thrombosed hemorrhoids. Causes of hemorrhoids include:  Constipation.   Straining to have a bowel movement.   HEAVY LIFTING HOME CARE INSTRUCTIONS  Eat a well balanced diet and drink 6 to 8 glasses of water every day to avoid constipation. You may also use a bulk laxative.   Avoid straining to have bowel movements.   Keep anal area dry and clean.   Do not use a donut shaped pillow or sit on the toilet for long periods. This increases blood pooling and pain.   Move  your bowels when your body has the urge; this will require less straining and will decrease pain and pressure.

## 2016-09-07 NOTE — H&P (Addendum)
Primary Care Physician:  [name] Primary Gastroenterologist:  Dr. Oneida Alar  Pre-Procedure History & Physical: HPI:  Timothy Mcdonald is a 51 y.o. male here for Anemia-Hb 12.8,nl MCV/FERRITIN. On ASA/ibuprofen/naproxen W/O PPI.  Past Medical History:  Diagnosis Date  . Arthritis    "joints" (12/03/2014)  . Diabetes mellitus type II, non insulin dependent ([place]) 2015  . GERD (gastroesophageal reflux disease)   . HLD (hyperlipidemia)   . HTN (hypertension) 2001  . Obesity   . OSA on CPAP    "got mask; wear it part-time" (12/03/2014)    Past Surgical History:  Procedure Laterality Date  . NO PAST SURGERIES      Prior to Admission medications   Medication Sig Start Date End Date Taking? Authorizing Provider  amLODipine (NORVASC) 10 MG tablet TAKE ONE TABLET BY MOUTH ONCE DAILY 12/30/15  Yes Arnoldo Lenis, MD  Artificial Tear Ointment (DRY EYES OP) Apply 2 drops to eye 2 (two) times daily as needed (dry eyes).   Yes Historical Provider, MD  aspirin 81 MG tablet Take 81 mg by mouth daily.   Yes Historical Provider, MD  cetirizine (ZYRTEC) 10 MG tablet Take 10 mg by mouth daily.   Yes Historical Provider, MD  cloNIDine (CATAPRES) 0.1 MG tablet Take 1 tablet (0.1 mg total) by mouth 2 (two) times daily. 12/04/14  Yes Almyra Deforest, PA  fluticasone (FLONASE) 50 MCG/ACT nasal spray Place 1 spray into both nostrils daily as needed for allergies or rhinitis.  10/01/14  Yes Historical Provider, MD  ibuprofen (ADVIL,MOTRIN) 200 MG tablet Take 400 mg by mouth daily as needed for mild pain.    Yes Historical Provider, MD  lisinopril-hydrochlorothiazide (PRINZIDE,ZESTORETIC) 20-12.5 MG per tablet Take 1 tablet by mouth daily.   Yes Historical Provider, MD  metFORMIN (GLUCOPHAGE-XR) 500 MG 24 hr tablet Take 500 mg by mouth daily with breakfast.  11/26/14  Yes Historical Provider, MD  metoprolol (LOPRESSOR) 50 MG tablet Take 1 tablet (50 mg total) by mouth 2 (two) times daily. 12/04/14  Yes Almyra Deforest, PA   pantoprazole (PROTONIX) 40 MG tablet Take 1 tablet (40 mg total) by mouth daily. Take 30 minutes before breakfast 08/19/16  Yes Annitta Needs, NP  polyethylene glycol-electrolytes (TRILYTE) 420 g solution Take 4,000 mLs by mouth as directed. 08/19/16  Yes Danie Binder, MD  rosuvastatin (CRESTOR) 5 MG tablet Take 1 tablet (5 mg total) by mouth daily. 12/25/14  Yes Arnoldo Lenis, MD    Allergies as of 08/19/2016  . (No Known Allergies)    Family History  Problem Relation Age of Onset  . Heart failure Brother   . Colon cancer Maternal Uncle   . Colon polyps Neg Hx     Social History   Social History  . Marital status: Married    Spouse name: N/A  . Number of children: N/A  . Years of education: N/A   Occupational History  . [employer]    Social History Main Topics  . Smoking status: Never Smoker  . Smokeless tobacco: Never Used  . Alcohol use No  . Drug use: No  . Sexual activity: Yes   Other Topics Concern  . Not on file   Social History Narrative   Married, lives in [place]. No history of CAD in parents or siblings. Brother has CHF, not sure why.   Full time - [employer].     Review of Systems: See HPI, otherwise negative ROS   Physical Exam:  BP (!) 141/82   Pulse 70   Temp 98.4 F (36.9 C) (Oral)   Resp 17   Ht 6' 2.5" (1.892 m)   Wt (!) 323 lb (146.5 kg)   SpO2 98%   BMI 40.92 kg/m  General:   Alert,  pleasant and cooperative in NAD Head:  Normocephalic and atraumatic. Neck:  Supple; Lungs:  Clear throughout to auscultation.    Heart:  Regular rate and rhythm. Abdomen:  Soft, nontender and nondistended. Normal bowel sounds, without guarding, and without rebound.   Neurologic:  Alert and  oriented x4;  grossly normal neurologically.  Impression/Plan:     Anemia-Hb 12.8,nl MCV/FERRITIN. On ASA/ibuprofen/naproxen W/O PPI.  PLAN:  1. TCS/?EGD TODAY. DISCUSSED PROCEDURE, BENEFITS, & RISKS: < 1% chance of medication reaction, bleeding,  perforation, or rupture of spleen/liver.

## 2016-09-07 NOTE — Progress Notes (Signed)
Timothy Mcdonald was at [facility] on 09/07/16 for a procedure and cannot return to work until noon on 09/08/16.

## 2016-09-11 ENCOUNTER — Encounter (HOSPITAL_COMMUNITY): Payer: Self-pay | Admitting: Gastroenterology

## 2016-09-15 DIAGNOSIS — H6123 Impacted cerumen, bilateral: Secondary | ICD-10-CM | POA: Diagnosis not present

## 2016-09-19 DIAGNOSIS — H6123 Impacted cerumen, bilateral: Secondary | ICD-10-CM | POA: Diagnosis not present

## 2016-09-26 ENCOUNTER — Telehealth: Payer: Self-pay | Admitting: Gastroenterology

## 2016-09-26 NOTE — Telephone Encounter (Signed)
Please call pt. HE had ONE simple adenoma removed. Please call pt. His stomach Bx shows gastritis DUE TO ASA/NSAIDS.  HIS BORDERLINE LOW BLOOD COUNT IS MOST LIKELY DUE TO GASTRITIS AND THE POLYP.   DRINK WATER TO KEEP YOUR URINE LIGHT YELLOW.  CONTINUE YOUR WEIGHT LOSS EFFORTS. TRANSITION TO A PLANT BASED DIET-NO MEAT OR DAIRY FOR 6 MOS.   CONTINUE PROTONIX. TAKE 30 MINUTES PRIOR TO BREAKFAST.  REPEAT CBC/FERRITIN 1 WEEK PRIOR TO OPV IN JUL 2018.   Next colonoscopy in 5-10 years.

## 2016-09-28 ENCOUNTER — Other Ambulatory Visit: Payer: Self-pay

## 2016-09-28 ENCOUNTER — Encounter: Payer: Self-pay | Admitting: Gastroenterology

## 2016-09-28 DIAGNOSIS — D649 Anemia, unspecified: Secondary | ICD-10-CM

## 2016-09-28 NOTE — Telephone Encounter (Signed)
Pt is aware.  

## 2016-09-28 NOTE — Telephone Encounter (Signed)
APPT MADE AND ON RECALL  °

## 2016-10-22 ENCOUNTER — Other Ambulatory Visit: Payer: Self-pay

## 2016-10-22 DIAGNOSIS — D649 Anemia, unspecified: Secondary | ICD-10-CM

## 2016-11-30 DIAGNOSIS — E782 Mixed hyperlipidemia: Secondary | ICD-10-CM | POA: Diagnosis not present

## 2016-11-30 DIAGNOSIS — R739 Hyperglycemia, unspecified: Secondary | ICD-10-CM | POA: Diagnosis not present

## 2016-11-30 DIAGNOSIS — I10 Essential (primary) hypertension: Secondary | ICD-10-CM | POA: Diagnosis not present

## 2016-11-30 DIAGNOSIS — E1165 Type 2 diabetes mellitus with hyperglycemia: Secondary | ICD-10-CM | POA: Diagnosis not present

## 2016-12-08 DIAGNOSIS — E782 Mixed hyperlipidemia: Secondary | ICD-10-CM | POA: Diagnosis not present

## 2016-12-08 DIAGNOSIS — G4733 Obstructive sleep apnea (adult) (pediatric): Secondary | ICD-10-CM | POA: Diagnosis not present

## 2016-12-08 DIAGNOSIS — E1165 Type 2 diabetes mellitus with hyperglycemia: Secondary | ICD-10-CM | POA: Diagnosis not present

## 2016-12-08 DIAGNOSIS — I10 Essential (primary) hypertension: Secondary | ICD-10-CM | POA: Diagnosis not present

## 2016-12-24 DIAGNOSIS — D649 Anemia, unspecified: Secondary | ICD-10-CM | POA: Diagnosis not present

## 2016-12-25 LAB — CBC WITH DIFFERENTIAL/PLATELET
BASOS ABS: 0 {cells}/uL (ref 0–200)
Basophils Relative: 0 %
EOS PCT: 4 %
Eosinophils Absolute: 348 cells/uL (ref 15–500)
HCT: 39.3 % (ref 38.5–50.0)
HEMOGLOBIN: 12.7 g/dL — AB (ref 13.2–17.1)
Lymphocytes Relative: 25 %
Lymphs Abs: 2175 cells/uL (ref 850–3900)
MCH: 29.8 pg (ref 27.0–33.0)
MCHC: 32.3 g/dL (ref 32.0–36.0)
MCV: 92.3 fL (ref 80.0–100.0)
MPV: 11 fL (ref 7.5–12.5)
Monocytes Absolute: 696 cells/uL (ref 200–950)
Monocytes Relative: 8 %
NEUTROS PCT: 63 %
Neutro Abs: 5481 cells/uL (ref 1500–7800)
PLATELETS: 374 10*3/uL (ref 140–400)
RBC: 4.26 MIL/uL (ref 4.20–5.80)
RDW: 14.9 % (ref 11.0–15.0)
WBC: 8.7 10*3/uL (ref 3.8–10.8)

## 2016-12-25 LAB — FERRITIN: FERRITIN: 112 ng/mL (ref 20–380)

## 2016-12-29 ENCOUNTER — Other Ambulatory Visit: Payer: Self-pay

## 2016-12-29 ENCOUNTER — Ambulatory Visit (INDEPENDENT_AMBULATORY_CARE_PROVIDER_SITE_OTHER): Payer: Federal, State, Local not specified - PPO | Admitting: Gastroenterology

## 2016-12-29 ENCOUNTER — Encounter: Payer: Self-pay | Admitting: Gastroenterology

## 2016-12-29 VITALS — BP 142/83 | HR 68 | Temp 97.3°F | Ht 74.0 in | Wt 334.2 lb

## 2016-12-29 DIAGNOSIS — D500 Iron deficiency anemia secondary to blood loss (chronic): Secondary | ICD-10-CM

## 2016-12-29 DIAGNOSIS — D649 Anemia, unspecified: Secondary | ICD-10-CM

## 2016-12-29 MED ORDER — PANTOPRAZOLE SODIUM 40 MG PO TBEC: 40.0000 mg | DELAYED_RELEASE_TABLET | Freq: Every day | ORAL | 3 refills | Status: DC

## 2016-12-29 NOTE — Patient Instructions (Signed)
Take Protonix once every day, 30 minutes before breakfast.   Let's check your blood count in 6 weeks. If it has not improved, we will do a capsule study where we take pictures of your small intestine.

## 2016-12-29 NOTE — Progress Notes (Signed)
Jan 2018: Creatinine 1.10, BUN 12

## 2016-12-29 NOTE — Progress Notes (Signed)
Referring Provider: Lanelle Bal, PA-C Primary Care Physician:  Lanelle Bal, PA-C  Primary GI: Dr. Oneida Alar   Chief Complaint  Patient presents with  . Anemia    f/u, doing ok    HPI:   Timothy Mcdonald is a 51 y.o. male presenting today with a history of mild normocytic anemia most likely due to NSAID induced gastritis. TCS/EGD on file.   Most recent CBC with Hgb 12.7, ferritin 112. Prior outside labs with Hgb 12, ferritin 181. Ferritin has dropped. Taking Aleve once every 2-3 weeks. Not on Protonix. No abdominal pain. No overt GI bleeding.   Past Medical History:  Diagnosis Date  . Arthritis    "joints" (12/03/2014)  . Diabetes mellitus type II, non insulin dependent ([place]) 2015  . GERD (gastroesophageal reflux disease)   . HLD (hyperlipidemia)   . HTN (hypertension) 2001  . Obesity   . OSA on CPAP    "got mask; wear it part-time" (12/03/2014)    Past Surgical History:  Procedure Laterality Date  . COLONOSCOPY N/A 09/07/2016   Normal ileum, one 41mm simple adenoma, diverticulosis in sigmoid, descending, splenic flexure and transverse colon, IH, extremely redundant left colon  . ESOPHAGOGASTRODUODENOSCOPY N/A 09/07/2016   mild normocytic anemia most likely due to NSAID induced gastritis, duodenal mucosal lymphagiectasia  . NO PAST SURGERIES      Current Outpatient Prescriptions  Medication Sig Dispense Refill  . amLODipine (NORVASC) 10 MG tablet TAKE ONE TABLET BY MOUTH ONCE DAILY 7 tablet 0  . Artificial Tear Ointment (DRY EYES OP) Apply 2 drops to eye 2 (two) times daily as needed (dry eyes).    [name] aspirin 81 MG tablet Take 81 mg by mouth daily.    . cetirizine (ZYRTEC) 10 MG tablet Take 10 mg by mouth daily.    . cloNIDine (CATAPRES) 0.1 MG tablet Take 1 tablet (0.1 mg total) by mouth 2 (two) times daily. (Patient taking differently: Take 0.1 mg by mouth daily. )    . ibuprofen (ADVIL,MOTRIN) 200 MG tablet Take 400 mg by mouth daily as needed for mild pain.     [name]  lisinopril-hydrochlorothiazide (PRINZIDE,ZESTORETIC) 20-12.5 MG per tablet Take 1 tablet by mouth daily.    . metFORMIN (GLUCOPHAGE-XR) 500 MG 24 hr tablet Take 500 mg by mouth daily with breakfast.     . metoprolol (LOPRESSOR) 50 MG tablet Take 1 tablet (50 mg total) by mouth 2 (two) times daily. (Patient taking differently: Take 50 mg by mouth daily. )    . rosuvastatin (CRESTOR) 5 MG tablet Take 1 tablet (5 mg total) by mouth daily. 90 tablet 3  . pantoprazole (PROTONIX) 40 MG tablet Take 1 tablet (40 mg total) by mouth daily. Take 30 minutes before breakfast. 90 tablet 3   No current facility-administered medications for this visit.     Allergies as of 12/29/2016  . (No Known Allergies)    Family History  Problem Relation Age of Onset  . Heart failure Brother   . Colon cancer Maternal Uncle   . Colon polyps Neg Hx     Social History   Social History  . Marital status: Married    Spouse name: N/A  . Number of children: N/A  . Years of education: N/A   Occupational History  . [employer]    Social History Main Topics  . Smoking status: Never Smoker  . Smokeless tobacco: Never Used  . Alcohol use No  . Drug use: No  .  Sexual activity: Yes   Other Topics Concern  . None   Social History Narrative   Married, lives in [place]. No history of CAD in parents or siblings. Brother has CHF, not sure why.   Full time - [employer].     Review of Systems: As mentioned in HPI   Physical Exam: BP (!) 142/83   Pulse 68   Temp (!) 97.3 F (36.3 C) (Oral)   Ht 6\' 2"  (1.88 m)   Wt (!) 334 lb 3.2 oz (151.6 kg)   BMI 42.91 kg/m  General:   Alert and oriented. No distress noted. Pleasant and cooperative.  Head:  Normocephalic and atraumatic. Eyes:  Conjuctiva clear without scleral icterus. Mouth:  Oral mucosa pink and moist. Good dentition. No lesions. Abdomen:  +BS, soft, non-tender and non-distended. No rebound or guarding. No HSM or masses noted. Msk:   Symmetrical without gross deformities. Normal posture. Extremities:  Without edema. Neurologic:  Alert and  oriented x4 Psych:  Alert and cooperative. Normal mood and affect.

## 2016-12-30 ENCOUNTER — Telehealth: Payer: Self-pay | Admitting: Gastroenterology

## 2016-12-30 ENCOUNTER — Encounter: Payer: Self-pay | Admitting: Gastroenterology

## 2016-12-30 NOTE — Assessment & Plan Note (Signed)
51 year old with mild normocytic anemia likely secondary to NSAID gastritis, recently undergoing TCS/EGD. He is not on a PPI and continues to take Aleve every few weeks. Will have him resume Protonix daily and recheck CBC/ferritin in 3 months as per Dr. Oneida Alar' recommendations. Will hold on capsule study at this time.

## 2016-12-30 NOTE — Progress Notes (Signed)
REVIEWED. HB STABLE FOR 2 YRS WITH NORMAL IRON STORES. CONTINUE ON ASA AND IBUPROFEN WITHOUT PPI. TAKE PROTONIX DAILY. RECHECK CBC/FERRITIN IN 3 MOS. NO NEED FOR GIVENS CAPSULE AT THIS TIME.

## 2016-12-30 NOTE — Telephone Encounter (Signed)
Doris:   I'm sorry, I've updated the plan for patient after seeing him and input from Dr. Oneida Alar. Please have him complete a CBC and ferritin in 3 months, not 6 weeks. Continue Protonix once daily. Also, please let him know Dr. Oneida Alar does not feel a capsule is needed at this time.

## 2016-12-31 NOTE — Progress Notes (Signed)
CC'ED TO PCP 

## 2016-12-31 NOTE — Telephone Encounter (Signed)
PT called back and was informed and that we will mail lab orders to him when due. Per Almyra Free, she needed to know what PPI's he has tried and he has not tried anything before, no over the counter meds either. Almyra Free is aware.

## 2016-12-31 NOTE — Telephone Encounter (Signed)
LMOM for a return call. Lab orders were ready to mail. I have taken them out and put note on due 03/31/2017 now.  They are in lab box to be mailed when due.

## 2017-01-26 ENCOUNTER — Telehealth: Payer: Self-pay | Admitting: Gastroenterology

## 2017-01-26 NOTE — Telephone Encounter (Signed)
RECALL FOR RECHECK CBC/FERRITIN

## 2017-01-26 NOTE — Telephone Encounter (Signed)
See 12/29/2016 OV note of Timothy Kaufman, NP, pt to do labs in Nov not Sept. Lab orders on file for 03/31/2017.

## 2017-05-04 DIAGNOSIS — Z23 Encounter for immunization: Secondary | ICD-10-CM | POA: Diagnosis not present

## 2017-05-15 DIAGNOSIS — R51 Headache: Secondary | ICD-10-CM | POA: Diagnosis not present

## 2017-05-15 DIAGNOSIS — R42 Dizziness and giddiness: Secondary | ICD-10-CM | POA: Diagnosis not present

## 2017-05-15 DIAGNOSIS — Z79899 Other long term (current) drug therapy: Secondary | ICD-10-CM | POA: Diagnosis not present

## 2017-05-15 DIAGNOSIS — E119 Type 2 diabetes mellitus without complications: Secondary | ICD-10-CM | POA: Diagnosis not present

## 2017-05-15 DIAGNOSIS — G44209 Tension-type headache, unspecified, not intractable: Secondary | ICD-10-CM | POA: Diagnosis not present

## 2017-05-15 DIAGNOSIS — I10 Essential (primary) hypertension: Secondary | ICD-10-CM | POA: Diagnosis not present

## 2017-06-03 DIAGNOSIS — M25561 Pain in right knee: Secondary | ICD-10-CM | POA: Diagnosis not present

## 2017-06-03 DIAGNOSIS — Z6841 Body Mass Index (BMI) 40.0 and over, adult: Secondary | ICD-10-CM | POA: Diagnosis not present

## 2017-06-07 DIAGNOSIS — D649 Anemia, unspecified: Secondary | ICD-10-CM | POA: Diagnosis not present

## 2017-06-07 DIAGNOSIS — E1165 Type 2 diabetes mellitus with hyperglycemia: Secondary | ICD-10-CM | POA: Diagnosis not present

## 2017-06-07 DIAGNOSIS — E782 Mixed hyperlipidemia: Secondary | ICD-10-CM | POA: Diagnosis not present

## 2017-06-07 DIAGNOSIS — R739 Hyperglycemia, unspecified: Secondary | ICD-10-CM | POA: Diagnosis not present

## 2017-06-07 DIAGNOSIS — I10 Essential (primary) hypertension: Secondary | ICD-10-CM | POA: Diagnosis not present

## 2017-06-10 DIAGNOSIS — D649 Anemia, unspecified: Secondary | ICD-10-CM | POA: Diagnosis not present

## 2017-06-10 DIAGNOSIS — E1165 Type 2 diabetes mellitus with hyperglycemia: Secondary | ICD-10-CM | POA: Diagnosis not present

## 2017-06-10 DIAGNOSIS — I10 Essential (primary) hypertension: Secondary | ICD-10-CM | POA: Diagnosis not present

## 2017-06-10 DIAGNOSIS — E782 Mixed hyperlipidemia: Secondary | ICD-10-CM | POA: Diagnosis not present

## 2017-12-02 DIAGNOSIS — M545 Low back pain: Secondary | ICD-10-CM | POA: Diagnosis not present

## 2017-12-02 DIAGNOSIS — Z6841 Body Mass Index (BMI) 40.0 and over, adult: Secondary | ICD-10-CM | POA: Diagnosis not present

## 2017-12-02 DIAGNOSIS — M5417 Radiculopathy, lumbosacral region: Secondary | ICD-10-CM | POA: Diagnosis not present

## 2017-12-02 DIAGNOSIS — M4807 Spinal stenosis, lumbosacral region: Secondary | ICD-10-CM | POA: Diagnosis not present

## 2017-12-08 DIAGNOSIS — E782 Mixed hyperlipidemia: Secondary | ICD-10-CM | POA: Diagnosis not present

## 2017-12-08 DIAGNOSIS — I10 Essential (primary) hypertension: Secondary | ICD-10-CM | POA: Diagnosis not present

## 2017-12-08 DIAGNOSIS — E1165 Type 2 diabetes mellitus with hyperglycemia: Secondary | ICD-10-CM | POA: Diagnosis not present

## 2017-12-08 DIAGNOSIS — G4733 Obstructive sleep apnea (adult) (pediatric): Secondary | ICD-10-CM | POA: Diagnosis not present

## 2017-12-16 DIAGNOSIS — I10 Essential (primary) hypertension: Secondary | ICD-10-CM | POA: Diagnosis not present

## 2017-12-16 DIAGNOSIS — G4733 Obstructive sleep apnea (adult) (pediatric): Secondary | ICD-10-CM | POA: Diagnosis not present

## 2017-12-16 DIAGNOSIS — E1165 Type 2 diabetes mellitus with hyperglycemia: Secondary | ICD-10-CM | POA: Diagnosis not present

## 2017-12-16 DIAGNOSIS — E782 Mixed hyperlipidemia: Secondary | ICD-10-CM | POA: Diagnosis not present

## 2017-12-16 DIAGNOSIS — R350 Frequency of micturition: Secondary | ICD-10-CM | POA: Diagnosis not present

## 2018-03-02 DIAGNOSIS — G4733 Obstructive sleep apnea (adult) (pediatric): Secondary | ICD-10-CM | POA: Diagnosis not present

## 2018-03-21 DIAGNOSIS — E1165 Type 2 diabetes mellitus with hyperglycemia: Secondary | ICD-10-CM | POA: Diagnosis not present

## 2018-03-21 DIAGNOSIS — I10 Essential (primary) hypertension: Secondary | ICD-10-CM | POA: Diagnosis not present

## 2018-03-21 DIAGNOSIS — R739 Hyperglycemia, unspecified: Secondary | ICD-10-CM | POA: Diagnosis not present

## 2018-03-21 DIAGNOSIS — E782 Mixed hyperlipidemia: Secondary | ICD-10-CM | POA: Diagnosis not present

## 2018-03-21 DIAGNOSIS — D649 Anemia, unspecified: Secondary | ICD-10-CM | POA: Diagnosis not present

## 2018-04-01 DIAGNOSIS — G4733 Obstructive sleep apnea (adult) (pediatric): Secondary | ICD-10-CM | POA: Diagnosis not present

## 2018-04-01 DIAGNOSIS — I10 Essential (primary) hypertension: Secondary | ICD-10-CM | POA: Diagnosis not present

## 2018-04-01 DIAGNOSIS — Z23 Encounter for immunization: Secondary | ICD-10-CM | POA: Diagnosis not present

## 2018-04-01 DIAGNOSIS — E782 Mixed hyperlipidemia: Secondary | ICD-10-CM | POA: Diagnosis not present

## 2018-04-01 DIAGNOSIS — E1165 Type 2 diabetes mellitus with hyperglycemia: Secondary | ICD-10-CM | POA: Diagnosis not present

## 2018-04-02 DIAGNOSIS — G4733 Obstructive sleep apnea (adult) (pediatric): Secondary | ICD-10-CM | POA: Diagnosis not present

## 2018-05-02 DIAGNOSIS — G4733 Obstructive sleep apnea (adult) (pediatric): Secondary | ICD-10-CM | POA: Diagnosis not present

## 2018-06-02 DIAGNOSIS — G4733 Obstructive sleep apnea (adult) (pediatric): Secondary | ICD-10-CM | POA: Diagnosis not present

## 2018-07-03 DIAGNOSIS — G4733 Obstructive sleep apnea (adult) (pediatric): Secondary | ICD-10-CM | POA: Diagnosis not present

## 2018-07-22 DIAGNOSIS — E782 Mixed hyperlipidemia: Secondary | ICD-10-CM | POA: Diagnosis not present

## 2018-07-22 DIAGNOSIS — R739 Hyperglycemia, unspecified: Secondary | ICD-10-CM | POA: Diagnosis not present

## 2018-07-22 DIAGNOSIS — D649 Anemia, unspecified: Secondary | ICD-10-CM | POA: Diagnosis not present

## 2018-07-22 DIAGNOSIS — I10 Essential (primary) hypertension: Secondary | ICD-10-CM | POA: Diagnosis not present

## 2018-07-22 DIAGNOSIS — E1165 Type 2 diabetes mellitus with hyperglycemia: Secondary | ICD-10-CM | POA: Diagnosis not present

## 2018-08-01 DIAGNOSIS — E1165 Type 2 diabetes mellitus with hyperglycemia: Secondary | ICD-10-CM | POA: Diagnosis not present

## 2018-08-01 DIAGNOSIS — I10 Essential (primary) hypertension: Secondary | ICD-10-CM | POA: Diagnosis not present

## 2018-08-01 DIAGNOSIS — G4733 Obstructive sleep apnea (adult) (pediatric): Secondary | ICD-10-CM | POA: Diagnosis not present

## 2018-08-01 DIAGNOSIS — E782 Mixed hyperlipidemia: Secondary | ICD-10-CM | POA: Diagnosis not present

## 2018-09-01 DIAGNOSIS — G4733 Obstructive sleep apnea (adult) (pediatric): Secondary | ICD-10-CM | POA: Diagnosis not present

## 2018-10-01 DIAGNOSIS — G4733 Obstructive sleep apnea (adult) (pediatric): Secondary | ICD-10-CM | POA: Diagnosis not present

## 2018-11-01 DIAGNOSIS — G4733 Obstructive sleep apnea (adult) (pediatric): Secondary | ICD-10-CM | POA: Diagnosis not present

## 2018-12-01 DIAGNOSIS — G4733 Obstructive sleep apnea (adult) (pediatric): Secondary | ICD-10-CM | POA: Diagnosis not present

## 2019-01-01 DIAGNOSIS — G4733 Obstructive sleep apnea (adult) (pediatric): Secondary | ICD-10-CM | POA: Diagnosis not present

## 2019-02-01 DIAGNOSIS — G4733 Obstructive sleep apnea (adult) (pediatric): Secondary | ICD-10-CM | POA: Diagnosis not present

## 2019-03-03 DIAGNOSIS — G4733 Obstructive sleep apnea (adult) (pediatric): Secondary | ICD-10-CM | POA: Diagnosis not present

## 2019-04-03 DIAGNOSIS — G4733 Obstructive sleep apnea (adult) (pediatric): Secondary | ICD-10-CM | POA: Diagnosis not present

## 2019-07-28 DIAGNOSIS — R29810 Facial weakness: Secondary | ICD-10-CM | POA: Diagnosis not present

## 2019-07-28 DIAGNOSIS — R4781 Slurred speech: Secondary | ICD-10-CM | POA: Diagnosis not present

## 2019-07-28 DIAGNOSIS — I639 Cerebral infarction, unspecified: Secondary | ICD-10-CM | POA: Diagnosis not present

## 2019-07-28 DIAGNOSIS — Z7984 Long term (current) use of oral hypoglycemic drugs: Secondary | ICD-10-CM | POA: Diagnosis not present

## 2019-07-28 DIAGNOSIS — Z20822 Contact with and (suspected) exposure to covid-19: Secondary | ICD-10-CM | POA: Diagnosis not present

## 2019-07-28 DIAGNOSIS — E119 Type 2 diabetes mellitus without complications: Secondary | ICD-10-CM | POA: Diagnosis not present

## 2019-07-28 DIAGNOSIS — I160 Hypertensive urgency: Secondary | ICD-10-CM | POA: Diagnosis not present

## 2019-07-28 DIAGNOSIS — I6523 Occlusion and stenosis of bilateral carotid arteries: Secondary | ICD-10-CM | POA: Diagnosis not present

## 2019-07-28 DIAGNOSIS — R42 Dizziness and giddiness: Secondary | ICD-10-CM | POA: Diagnosis not present

## 2019-07-28 DIAGNOSIS — I6381 Other cerebral infarction due to occlusion or stenosis of small artery: Secondary | ICD-10-CM | POA: Diagnosis not present

## 2019-07-28 DIAGNOSIS — Z79899 Other long term (current) drug therapy: Secondary | ICD-10-CM | POA: Diagnosis not present

## 2019-07-28 DIAGNOSIS — I10 Essential (primary) hypertension: Secondary | ICD-10-CM | POA: Diagnosis not present

## 2019-07-29 DIAGNOSIS — E876 Hypokalemia: Secondary | ICD-10-CM | POA: Diagnosis not present

## 2019-07-29 DIAGNOSIS — Z20822 Contact with and (suspected) exposure to covid-19: Secondary | ICD-10-CM | POA: Diagnosis not present

## 2019-07-29 DIAGNOSIS — I6381 Other cerebral infarction due to occlusion or stenosis of small artery: Secondary | ICD-10-CM | POA: Diagnosis not present

## 2019-07-29 DIAGNOSIS — E119 Type 2 diabetes mellitus without complications: Secondary | ICD-10-CM | POA: Diagnosis not present

## 2019-07-29 DIAGNOSIS — R29702 NIHSS score 2: Secondary | ICD-10-CM | POA: Diagnosis not present

## 2019-07-29 DIAGNOSIS — Z79899 Other long term (current) drug therapy: Secondary | ICD-10-CM | POA: Diagnosis not present

## 2019-07-29 DIAGNOSIS — R29701 NIHSS score 1: Secondary | ICD-10-CM | POA: Diagnosis not present

## 2019-07-29 DIAGNOSIS — R42 Dizziness and giddiness: Secondary | ICD-10-CM | POA: Diagnosis not present

## 2019-07-29 DIAGNOSIS — Z9114 Patient's other noncompliance with medication regimen: Secondary | ICD-10-CM | POA: Diagnosis not present

## 2019-07-29 DIAGNOSIS — E782 Mixed hyperlipidemia: Secondary | ICD-10-CM | POA: Diagnosis not present

## 2019-07-29 DIAGNOSIS — R471 Dysarthria and anarthria: Secondary | ICD-10-CM | POA: Diagnosis not present

## 2019-07-29 DIAGNOSIS — I10 Essential (primary) hypertension: Secondary | ICD-10-CM | POA: Diagnosis not present

## 2019-07-29 DIAGNOSIS — R29898 Other symptoms and signs involving the musculoskeletal system: Secondary | ICD-10-CM | POA: Diagnosis not present

## 2019-07-29 DIAGNOSIS — Z7984 Long term (current) use of oral hypoglycemic drugs: Secondary | ICD-10-CM | POA: Diagnosis not present

## 2019-07-29 DIAGNOSIS — R4781 Slurred speech: Secondary | ICD-10-CM | POA: Diagnosis not present

## 2019-07-29 DIAGNOSIS — Z6841 Body Mass Index (BMI) 40.0 and over, adult: Secondary | ICD-10-CM | POA: Diagnosis not present

## 2019-07-29 DIAGNOSIS — I63511 Cerebral infarction due to unspecified occlusion or stenosis of right middle cerebral artery: Secondary | ICD-10-CM | POA: Diagnosis not present

## 2019-07-29 DIAGNOSIS — I639 Cerebral infarction, unspecified: Secondary | ICD-10-CM | POA: Diagnosis not present

## 2019-07-30 DIAGNOSIS — R531 Weakness: Secondary | ICD-10-CM | POA: Diagnosis not present

## 2019-07-30 DIAGNOSIS — R918 Other nonspecific abnormal finding of lung field: Secondary | ICD-10-CM | POA: Diagnosis not present

## 2019-07-30 DIAGNOSIS — I63511 Cerebral infarction due to unspecified occlusion or stenosis of right middle cerebral artery: Secondary | ICD-10-CM | POA: Diagnosis not present

## 2019-07-30 DIAGNOSIS — I517 Cardiomegaly: Secondary | ICD-10-CM | POA: Diagnosis not present

## 2019-08-02 DIAGNOSIS — I63511 Cerebral infarction due to unspecified occlusion or stenosis of right middle cerebral artery: Secondary | ICD-10-CM | POA: Diagnosis not present

## 2019-08-04 DIAGNOSIS — I639 Cerebral infarction, unspecified: Secondary | ICD-10-CM | POA: Diagnosis not present

## 2019-08-04 DIAGNOSIS — Z6841 Body Mass Index (BMI) 40.0 and over, adult: Secondary | ICD-10-CM | POA: Diagnosis not present

## 2019-08-04 DIAGNOSIS — I10 Essential (primary) hypertension: Secondary | ICD-10-CM | POA: Diagnosis not present

## 2019-08-17 DIAGNOSIS — I639 Cerebral infarction, unspecified: Secondary | ICD-10-CM | POA: Diagnosis not present

## 2019-10-19 DIAGNOSIS — I10 Essential (primary) hypertension: Secondary | ICD-10-CM | POA: Diagnosis not present

## 2019-10-19 DIAGNOSIS — E782 Mixed hyperlipidemia: Secondary | ICD-10-CM | POA: Diagnosis not present

## 2019-10-19 DIAGNOSIS — R739 Hyperglycemia, unspecified: Secondary | ICD-10-CM | POA: Diagnosis not present

## 2019-10-19 DIAGNOSIS — N400 Enlarged prostate without lower urinary tract symptoms: Secondary | ICD-10-CM | POA: Diagnosis not present

## 2019-10-19 DIAGNOSIS — E1165 Type 2 diabetes mellitus with hyperglycemia: Secondary | ICD-10-CM | POA: Diagnosis not present

## 2019-10-19 DIAGNOSIS — D649 Anemia, unspecified: Secondary | ICD-10-CM | POA: Diagnosis not present

## 2019-10-26 DIAGNOSIS — E782 Mixed hyperlipidemia: Secondary | ICD-10-CM | POA: Diagnosis not present

## 2019-10-26 DIAGNOSIS — G4733 Obstructive sleep apnea (adult) (pediatric): Secondary | ICD-10-CM | POA: Diagnosis not present

## 2019-10-26 DIAGNOSIS — E1165 Type 2 diabetes mellitus with hyperglycemia: Secondary | ICD-10-CM | POA: Diagnosis not present

## 2019-10-26 DIAGNOSIS — I10 Essential (primary) hypertension: Secondary | ICD-10-CM | POA: Diagnosis not present

## 2019-12-01 DIAGNOSIS — E876 Hypokalemia: Secondary | ICD-10-CM | POA: Diagnosis not present

## 2019-12-01 DIAGNOSIS — N182 Chronic kidney disease, stage 2 (mild): Secondary | ICD-10-CM | POA: Diagnosis not present

## 2019-12-01 DIAGNOSIS — I5032 Chronic diastolic (congestive) heart failure: Secondary | ICD-10-CM | POA: Diagnosis not present

## 2019-12-01 DIAGNOSIS — I129 Hypertensive chronic kidney disease with stage 1 through stage 4 chronic kidney disease, or unspecified chronic kidney disease: Secondary | ICD-10-CM | POA: Diagnosis not present

## 2019-12-26 ENCOUNTER — Other Ambulatory Visit (HOSPITAL_COMMUNITY): Payer: Self-pay | Admitting: Nephrology

## 2019-12-26 ENCOUNTER — Other Ambulatory Visit: Payer: Self-pay | Admitting: Nephrology

## 2019-12-26 DIAGNOSIS — N182 Chronic kidney disease, stage 2 (mild): Secondary | ICD-10-CM

## 2019-12-26 DIAGNOSIS — I10 Essential (primary) hypertension: Secondary | ICD-10-CM

## 2020-01-02 ENCOUNTER — Ambulatory Visit (HOSPITAL_COMMUNITY)
Admission: RE | Admit: 2020-01-02 | Discharge: 2020-01-02 | Disposition: A | Discharge status: Home / Self Care | Payer: Federal, State, Local not specified - PPO | Source: Ambulatory Visit | Attending: Nephrology | Admitting: Nephrology

## 2020-01-02 ENCOUNTER — Other Ambulatory Visit: Payer: Self-pay

## 2020-01-02 DIAGNOSIS — I129 Hypertensive chronic kidney disease with stage 1 through stage 4 chronic kidney disease, or unspecified chronic kidney disease: Secondary | ICD-10-CM | POA: Diagnosis not present

## 2020-01-02 DIAGNOSIS — N182 Chronic kidney disease, stage 2 (mild): Secondary | ICD-10-CM | POA: Diagnosis not present

## 2020-01-02 DIAGNOSIS — E876 Hypokalemia: Secondary | ICD-10-CM | POA: Diagnosis not present

## 2020-01-02 DIAGNOSIS — I5032 Chronic diastolic (congestive) heart failure: Secondary | ICD-10-CM | POA: Diagnosis not present

## 2020-01-02 DIAGNOSIS — I10 Essential (primary) hypertension: Secondary | ICD-10-CM | POA: Insufficient documentation

## 2020-01-02 DIAGNOSIS — Z79899 Other long term (current) drug therapy: Secondary | ICD-10-CM | POA: Diagnosis not present

## 2020-01-02 DIAGNOSIS — N189 Chronic kidney disease, unspecified: Secondary | ICD-10-CM | POA: Diagnosis not present

## 2020-01-12 DIAGNOSIS — N182 Chronic kidney disease, stage 2 (mild): Secondary | ICD-10-CM | POA: Diagnosis not present

## 2020-01-12 DIAGNOSIS — I5032 Chronic diastolic (congestive) heart failure: Secondary | ICD-10-CM | POA: Diagnosis not present

## 2020-01-12 DIAGNOSIS — E876 Hypokalemia: Secondary | ICD-10-CM | POA: Diagnosis not present

## 2020-01-12 DIAGNOSIS — D638 Anemia in other chronic diseases classified elsewhere: Secondary | ICD-10-CM | POA: Diagnosis not present

## 2020-01-14 ENCOUNTER — Encounter: Payer: Self-pay | Admitting: Internal Medicine

## 2020-02-02 DIAGNOSIS — I5032 Chronic diastolic (congestive) heart failure: Secondary | ICD-10-CM | POA: Diagnosis not present

## 2020-02-02 DIAGNOSIS — N182 Chronic kidney disease, stage 2 (mild): Secondary | ICD-10-CM | POA: Diagnosis not present

## 2020-02-02 DIAGNOSIS — D638 Anemia in other chronic diseases classified elsewhere: Secondary | ICD-10-CM | POA: Diagnosis not present

## 2020-02-02 DIAGNOSIS — E876 Hypokalemia: Secondary | ICD-10-CM | POA: Diagnosis not present

## 2020-02-02 DIAGNOSIS — E1129 Type 2 diabetes mellitus with other diabetic kidney complication: Secondary | ICD-10-CM | POA: Diagnosis not present

## 2020-02-21 ENCOUNTER — Ambulatory Visit: Payer: Federal, State, Local not specified - PPO | Admitting: Internal Medicine

## 2020-02-22 DIAGNOSIS — I10 Essential (primary) hypertension: Secondary | ICD-10-CM | POA: Diagnosis not present

## 2020-02-22 DIAGNOSIS — R5383 Other fatigue: Secondary | ICD-10-CM | POA: Diagnosis not present

## 2020-02-22 DIAGNOSIS — R739 Hyperglycemia, unspecified: Secondary | ICD-10-CM | POA: Diagnosis not present

## 2020-02-22 DIAGNOSIS — E1165 Type 2 diabetes mellitus with hyperglycemia: Secondary | ICD-10-CM | POA: Diagnosis not present

## 2020-02-22 DIAGNOSIS — E782 Mixed hyperlipidemia: Secondary | ICD-10-CM | POA: Diagnosis not present

## 2020-03-13 ENCOUNTER — Ambulatory Visit: Payer: Federal, State, Local not specified - PPO | Admitting: Internal Medicine

## 2020-03-13 ENCOUNTER — Encounter: Payer: Self-pay | Admitting: Internal Medicine

## 2020-03-13 ENCOUNTER — Other Ambulatory Visit: Payer: Self-pay

## 2020-03-13 VITALS — BP 175/92 | HR 90 | Temp 97.9°F | Ht 74.0 in | Wt 327.8 lb

## 2020-03-13 DIAGNOSIS — E782 Mixed hyperlipidemia: Secondary | ICD-10-CM | POA: Diagnosis not present

## 2020-03-13 DIAGNOSIS — E1165 Type 2 diabetes mellitus with hyperglycemia: Secondary | ICD-10-CM | POA: Diagnosis not present

## 2020-03-13 DIAGNOSIS — D509 Iron deficiency anemia, unspecified: Secondary | ICD-10-CM

## 2020-03-13 DIAGNOSIS — Z79899 Other long term (current) drug therapy: Secondary | ICD-10-CM | POA: Diagnosis not present

## 2020-03-13 DIAGNOSIS — E876 Hypokalemia: Secondary | ICD-10-CM | POA: Diagnosis not present

## 2020-03-13 DIAGNOSIS — R5383 Other fatigue: Secondary | ICD-10-CM | POA: Diagnosis not present

## 2020-03-13 DIAGNOSIS — I10 Essential (primary) hypertension: Secondary | ICD-10-CM | POA: Diagnosis not present

## 2020-03-13 DIAGNOSIS — Z86010 Personal history of colonic polyps: Secondary | ICD-10-CM

## 2020-03-13 DIAGNOSIS — Z23 Encounter for immunization: Secondary | ICD-10-CM | POA: Diagnosis not present

## 2020-03-13 DIAGNOSIS — D631 Anemia in chronic kidney disease: Secondary | ICD-10-CM | POA: Diagnosis not present

## 2020-03-13 DIAGNOSIS — N182 Chronic kidney disease, stage 2 (mild): Secondary | ICD-10-CM | POA: Diagnosis not present

## 2020-03-13 NOTE — Patient Instructions (Signed)
We will plan on repeating colonoscopy in 2023 for surveillance purposes.  If your anemia worsens or you develop signs of GI bleeding such as blood in your stool, we will need to perform this sooner.  Please call office if this happens.  Otherwise follow-up with GI as needed.  At [facility] we value your feedback. You may receive a survey about your visit today. Please share your experience as we strive to create trusting relationships with our patients to provide genuine, compassionate, quality care.  We appreciate your understanding and patience as we review any laboratory studies, imaging, and other diagnostic tests that are ordered as we care for you. Our office policy is 5 business days for review of these results, and any emergent or urgent results are addressed in a timely manner for your best interest. If you do not hear from our office in 1 week, please contact us.   We also encourage the use of MyChart, which contains your medical information for your review as well. If you are not enrolled in this feature, an access code is on this after visit summary for your convenience. Thank you for allowing [place] to be involved in your care.  It was great to see you today!  I hope you have a great rest of your fall!!    Azariah Latendresse K. Abbey Chatters, D.O. Gastroenterology and Hepatology [facility]

## 2020-03-13 NOTE — Progress Notes (Signed)
Primary Care Physician:  Lanelle Bal, PA-C Primary Gastroenterologist:  Dr. Abbey Chatters  Chief Complaint  Patient presents with  . Anemia    TCS/EGD done last 2018    HPI:   Timothy Mcdonald is a 54 y.o. male who presents to the clinic today by referral from his nephrologist for iron deficiency anemia.  Patient on recent blood work had a hemoglobin of 12.1.  Ferritin and iron were also both low.  He is started on iron supplementation and repeat hemoglobin 12.3.  Patient denies any melena or hematochezia.  Underwent EGD in April 2018 that showed gastritis, H. pylori negative.  Colonoscopy revealed 1 rectal polyp which was a tubular adenoma on pathology.  Also extensive diverticulosis.  Patient denies any family history of colorectal malignancy.  No chronic reflux or heartburn.  No dysphagia or odynophagia.  No abdominal pain.  No unintentional weight loss.  Otherwise he has no other complaints.  Past Medical History:  Diagnosis Date  . Arthritis    "joints" (12/03/2014)  . Diabetes mellitus type II, non insulin dependent ([place]) 2015  . GERD (gastroesophageal reflux disease)   . HLD (hyperlipidemia)   . HTN (hypertension) 2001  . Obesity   . OSA on CPAP    "got mask; wear it part-time" (12/03/2014)    Past Surgical History:  Procedure Laterality Date  . COLONOSCOPY N/A 09/07/2016   Normal ileum, one 8mm simple adenoma, diverticulosis in sigmoid, descending, splenic flexure and transverse colon, IH, extremely redundant left colon  . ESOPHAGOGASTRODUODENOSCOPY N/A 09/07/2016   mild normocytic anemia most likely due to NSAID induced gastritis, duodenal mucosal lymphagiectasia  . NO PAST SURGERIES      Current Outpatient Medications  Medication Sig Dispense Refill  . amLODipine (NORVASC) 10 MG tablet TAKE ONE TABLET BY MOUTH ONCE DAILY 7 tablet 0  . aspirin 325 MG tablet Take 325 mg by mouth daily.    [name] atorvastatin (LIPITOR) 80 MG tablet Take 80 mg by mouth daily.    . carvedilol  (COREG) 12.5 MG tablet Take 12.5 mg by mouth 2 (two) times daily with a meal.    . chlorthalidone (HYGROTON) 25 MG tablet Take 25 mg by mouth daily.    . Cholecalciferol (D3) 50 MCG (2000 UT) TABS Take by mouth daily.    . ferrous sulfate 325 (65 FE) MG tablet Take 325 mg by mouth 3 (three) times daily with meals.    [name] lisinopril (ZESTRIL) 40 MG tablet Take 40 mg by mouth daily.    . metFORMIN (GLUCOPHAGE-XR) 500 MG 24 hr tablet Take 1,000 mg by mouth daily with breakfast.     . potassium chloride (KLOR-CON) 20 MEQ packet Take 20 mEq by mouth daily.     No current facility-administered medications for this visit.    Allergies as of 03/13/2020  . (No Known Allergies)    Family History  Problem Relation Age of Onset  . Heart failure Brother   . Colon cancer Maternal Uncle   . Colon polyps Neg Hx     Social History   Socioeconomic History  . Marital status: Married    Spouse name: Not on file  . Number of children: Not on file  . Years of education: Not on file  . Highest education level: Not on file  Occupational History  . Occupation: [place] [employer]  Tobacco Use  . Smoking status: Never Smoker  . Smokeless tobacco: Never Used  Substance and Sexual Activity  . Alcohol use: No  Alcohol/week: 0.0 standard drinks  . Drug use: No  . Sexual activity: Yes  Other Topics Concern  . Not on file  Social History Narrative   Married, lives in [place]. No history of CAD in parents or siblings. Brother has CHF, not sure why.   Full time - [employer].    Social Determinants of Health   Financial Resource Strain:   . Difficulty of Paying Living Expenses: Not on file  Food Insecurity:   . Worried About Charity fundraiser in the Last Year: Not on file  . Ran Out of Food in the Last Year: Not on file  Transportation Needs:   . Lack of Transportation (Medical): Not on file  . Lack of Transportation (Non-Medical): Not on file  Physical Activity:   . Days of Exercise per  Week: Not on file  . Minutes of Exercise per Session: Not on file  Stress:   . Feeling of Stress : Not on file  Social Connections:   . Frequency of Communication with Friends and Family: Not on file  . Frequency of Social Gatherings with Friends and Family: Not on file  . Attends Religious Services: Not on file  . Active Member of Clubs or Organizations: Not on file  . Attends Archivist Meetings: Not on file  . Marital Status: Not on file  Intimate Partner Violence:   . Fear of Current or Ex-Partner: Not on file  . Emotionally Abused: Not on file  . Physically Abused: Not on file  . Sexually Abused: Not on file    Subjective: Review of Systems  Constitutional: Negative for chills and fever.  HENT: Negative for congestion and hearing loss.   Eyes: Negative for blurred vision and double vision.  Respiratory: Negative for cough and shortness of breath.   Cardiovascular: Negative for chest pain and palpitations.  Gastrointestinal: Negative for abdominal pain, blood in stool, constipation, diarrhea, heartburn, melena and vomiting.  Genitourinary: Negative for dysuria and urgency.  Musculoskeletal: Negative for joint pain and myalgias.  Skin: Negative for itching and rash.  Neurological: Negative for dizziness and headaches.  Psychiatric/Behavioral: Negative for depression. The patient is not nervous/anxious.        Objective: BP (!) 175/92   Pulse 90   Temp 97.9 F (36.6 C)   Ht 6\' 2"  (1.88 m)   Wt (!) 327 lb 12.8 oz (148.7 kg)   BMI 42.09 kg/m  Physical Exam Constitutional:      Appearance: Normal appearance. He is obese.  HENT:     Head: Normocephalic and atraumatic.  Eyes:     Extraocular Movements: Extraocular movements intact.     Conjunctiva/sclera: Conjunctivae normal.  Cardiovascular:     Rate and Rhythm: Normal rate and regular rhythm.  Pulmonary:     Effort: Pulmonary effort is normal.     Breath sounds: Normal breath sounds.  Abdominal:      General: Bowel sounds are normal.     Palpations: Abdomen is soft.  Musculoskeletal:        General: Normal range of motion.     Cervical back: Normal range of motion and neck supple.  Skin:    General: Skin is warm.  Neurological:     General: No focal deficit present.     Mental Status: He is alert and oriented to person, place, and time.  Psychiatric:        Mood and Affect: Mood normal.        Behavior:  Behavior normal.      Assessment: *Iron deficiency anemia *History of adenomatous colon polyps  Plan: Patient's anemia is mild and stable.  Repeat hemoglobin 12.3 now that he is taking iron supplementation.  He has had no signs of blood in his stool.  Discussed that we could repeat EGD and colonoscopy at this time for a full work-up and patient would like to hold off for now.  Given his history of polyps I would recommend that he undergo colonoscopy in 2023 and he is agreeable.  We may need to perform EGD at that time pending his clinical course.  Counseled patient that if his anemia worsens or he has active signs of GI bleeding, we will need to perform endoscopy sooner and he understands.  Patient follow-up as needed.  03/13/2020 4:14 PM   Disclaimer: This note was dictated with voice recognition software. Similar sounding words can inadvertently be transcribed and may not be corrected upon review.

## 2021-09-02 ENCOUNTER — Encounter: Payer: Self-pay | Admitting: *Deleted
